# Patient Record
Sex: Female | Born: 2000 | Race: White | Hispanic: No | Marital: Single | Smoking: Former smoker
Health system: Southern US, Community
[De-identification: ages and names within clinical notes are randomized; demographics above are authoritative.]

## PROBLEM LIST (undated history)

## (undated) DIAGNOSIS — F32A Depression, unspecified: Secondary | ICD-10-CM

## (undated) DIAGNOSIS — F329 Major depressive disorder, single episode, unspecified: Secondary | ICD-10-CM

## (undated) DIAGNOSIS — F909 Attention-deficit hyperactivity disorder, unspecified type: Secondary | ICD-10-CM

## (undated) DIAGNOSIS — R519 Headache, unspecified: Secondary | ICD-10-CM

## (undated) DIAGNOSIS — F419 Anxiety disorder, unspecified: Secondary | ICD-10-CM

## (undated) DIAGNOSIS — R51 Headache: Secondary | ICD-10-CM

---
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
## 2020-01-01 LAB — NOVEL CORONAVIRUS, NAA: SARS-CoV-2, NAA: NOT DETECTED

## 2020-01-01 LAB — SARS-COV-2, NAA 2 DAY TAT

## 2020-01-07 ENCOUNTER — Other Ambulatory Visit: Payer: Self-pay | Admitting: Nurse Practitioner

## 2020-01-07 ENCOUNTER — Encounter: Payer: Self-pay | Admitting: Nurse Practitioner

## 2020-01-07 MED ORDER — CEPHALEXIN 500 MG PO CAPS
500.0000 mg | ORAL_CAPSULE | Freq: Three times a day (TID) | ORAL | 0 refills | Status: DC
Start: 2020-01-07 — End: 2020-02-25

## 2020-02-18 ENCOUNTER — Encounter: Payer: Self-pay | Admitting: Nurse Practitioner

## 2020-02-23 ENCOUNTER — Encounter: Payer: Self-pay | Admitting: Nurse Practitioner

## 2020-02-25 ENCOUNTER — Encounter: Payer: Self-pay | Admitting: Family Medicine

## 2020-02-25 ENCOUNTER — Other Ambulatory Visit: Payer: Self-pay

## 2020-02-25 ENCOUNTER — Telehealth: Payer: Self-pay | Admitting: Nurse Practitioner

## 2020-02-25 ENCOUNTER — Encounter: Payer: Self-pay | Admitting: Nurse Practitioner

## 2020-02-25 ENCOUNTER — Ambulatory Visit (INDEPENDENT_AMBULATORY_CARE_PROVIDER_SITE_OTHER): Payer: Medicaid Other | Admitting: Nurse Practitioner

## 2020-02-25 VITALS — BP 110/78 | Temp 96.0°F | Wt 128.6 lb

## 2020-02-25 DIAGNOSIS — I889 Nonspecific lymphadenitis, unspecified: Secondary | ICD-10-CM | POA: Diagnosis not present

## 2020-02-25 MED ORDER — CEPHALEXIN 500 MG PO CAPS: 500.0000 mg | ORAL_CAPSULE | Freq: Three times a day (TID) | ORAL | 0 refills | Status: DC

## 2020-02-25 NOTE — Telephone Encounter (Signed)
Patient seen today at 3:40.

## 2020-02-25 NOTE — Telephone Encounter (Signed)
Pt contacted to see if she can come in at 3:40 pm today. Pt states the she does not believe she will be able to make it due to work. Pt states that area has become bigger, she believes. Tender to touch. Pt states she would go to local Urgent Care but she knows from experience that they will have her in and out in 10 minutes.

## 2020-02-25 NOTE — Patient Instructions (Signed)
Try warm compresses to the affected area. Wear loose fitting underwear to decrease pressure and friction to the area.

## 2020-02-25 NOTE — Progress Notes (Signed)
   Subjective:    Patient ID: Lisa Cervantes, female    DOB: 2000/08/20, 19 y.o.   MRN: [id number]  HPI Pt here for bump on inside of left thigh. Has been there about 2 weeks ago. Tender to touch and when walking. Has increased in size. Denies any other lymphadenopathy. Does shave her pubic area.     Review of Systems  Constitutional: Negative for chills and fever.  Gastrointestinal: Negative for abdominal distention, abdominal pain, anal bleeding, blood in stool, constipation, diarrhea, nausea and vomiting.  Genitourinary: Negative for difficulty urinating, dyspareunia, dysuria, flank pain, frequency, genital sores, hematuria, menstrual problem, urgency, vaginal discharge and vaginal pain.       Pt states " I have a period like cramp at times when I pee" LMP 10/5, she reports they are irregular, but denies severe cramping or heavy bleeding. She is sexually active. No new sexual partners since last visit. Denies any recent bumps or sores in her pubic region.       Objective:   Physical Exam Constitutional:      Appearance: She is well-developed and well-groomed.  Cardiovascular:     Rate and Rhythm: Normal rate and regular rhythm.     Pulses: Normal pulses.     Heart sounds: Normal heart sounds. No murmur heard.   Pulmonary:     Effort: Pulmonary effort is normal.     Breath sounds: Normal breath sounds. No wheezing or rhonchi.  Lymphadenopathy:     Cervical: No cervical adenopathy.     Upper Body:     Right upper body: No supraclavicular adenopathy.     Left upper body: No supraclavicular adenopathy.     Lower Body: No right inguinal adenopathy. Left inguinal adenopathy present.     Comments: Left inguinal nodes are firm 1.5 cm in diameter and tender. Appears to be 2 nodes together towards the lower left inguinal area.   Skin:    General: Skin is warm and dry.     Comments: Pelvic and inguinal region is shaved without folliculitis, erythema, rash, lesions, or abscess.    Neurological:     Mental Status: She is alert.     Today's Vitals   02/25/20 1553  BP: 110/78  Temp: (!) 96 F (35.6 C)  Weight: 58.3 kg   Body mass index is 22.78 kg/m.      Assessment & Plan:   Inguinal lymphadenitis  Meds ordered this encounter  Medications  . cephALEXin (KEFLEX) 500 MG capsule    Sig: Take 1 capsule (500 mg total) by mouth 3 (three) times daily.    Dispense:  21 capsule    Refill:  0    Order Specific Question:   Supervising Provider    Answer:   Lilyan Punt A [[id number]]    Try warm compresses to the affected area. Wear loose fitting underwear to decrease pressure and friction to the area. Call back in 7 days if no improvement, sooner if worse. Warning signs reviewed.  Return if symptoms worsen or fail to improve.

## 2020-02-27 ENCOUNTER — Encounter: Payer: Self-pay | Admitting: Nurse Practitioner

## 2020-02-27 NOTE — Progress Notes (Signed)
   Subjective:    Patient ID: Lisa Cervantes, female    DOB: 2001/02/21, 18 y.o.   MRN: [id number]  HPI    Review of Systems     Objective:   Physical Exam        Assessment & Plan:

## 2020-02-28 ENCOUNTER — Encounter: Payer: Self-pay | Admitting: Nurse Practitioner

## 2020-03-21 ENCOUNTER — Encounter: Payer: Self-pay | Admitting: Nurse Practitioner

## 2020-03-23 ENCOUNTER — Other Ambulatory Visit: Payer: Self-pay

## 2020-03-23 ENCOUNTER — Telehealth (INDEPENDENT_AMBULATORY_CARE_PROVIDER_SITE_OTHER): Payer: Medicaid Other | Admitting: Family Medicine

## 2020-03-23 ENCOUNTER — Encounter: Payer: Self-pay | Admitting: Family Medicine

## 2020-03-23 DIAGNOSIS — T7840XD Allergy, unspecified, subsequent encounter: Secondary | ICD-10-CM

## 2020-03-23 DIAGNOSIS — R059 Cough, unspecified: Secondary | ICD-10-CM | POA: Diagnosis not present

## 2020-03-23 DIAGNOSIS — T7840XA Allergy, unspecified, initial encounter: Secondary | ICD-10-CM | POA: Insufficient documentation

## 2020-03-23 MED ORDER — BENZONATATE 100 MG PO CAPS: 100.0000 mg | ORAL_CAPSULE | Freq: Two times a day (BID) | ORAL | 0 refills | Status: DC | PRN

## 2020-03-23 MED ORDER — ALBUTEROL SULFATE HFA 108 (90 BASE) MCG/ACT IN AERS: 2.0000 | INHALATION_SPRAY | Freq: Four times a day (QID) | RESPIRATORY_TRACT | 1 refills | Status: DC | PRN

## 2020-03-23 MED ORDER — CETIRIZINE HCL 10 MG PO TABS: 10.0000 mg | ORAL_TABLET | Freq: Every day | ORAL | 2 refills | Status: DC

## 2020-03-23 NOTE — Progress Notes (Signed)
Patient ID: Lisa Cervantes, female    DOB: [birth year], 19 y.o.   MRN: [id number]   Chief Complaint  Patient presents with  . Sore Throat   Subjective:  CC: throat scratchy and dry cough  Presents today for a telephone visit.  Virtual video visit attempted unable to connect.  She complains of a scratchy throat and a slightly dry cough.  The onset of symptoms was 4 days ago.  Pertinent negatives include no fever, no chills, no fatigue, no headache, no chest pain, no shortness of breath, no congestion, no ear pain, no abdominal pain, no urinary symptoms.  Associated symptoms include cough and pertinent information is she is a smoker.  As the conversation progressed she reported that she is unable to take a deep breath but does not describe it as shortness of breath.  She reports that she does have seasonal allergies.  Sore Throat  This is a new problem. Episode onset: 4 days ago. Associated symptoms include coughing, a hoarse voice and shortness of breath. Pertinent negatives include no abdominal pain, congestion or ear pain. Treatments tried: otc allergy.   See phq9.   Virtual Visit via Video Note  I connected with [name] on 03/23/20 at  2:30 PM EST by a video enabled telemedicine application and verified that I am speaking with the correct person using two identifiers.  Location: Patient: home Provider: office    I discussed the limitations of evaluation and management by telemedicine and the availability of in person appointments. The patient expressed understanding and agreed to proceed.  History of Present Illness:    Observations/Objective:   Assessment and Plan:   Follow Up Instructions:    I discussed the assessment and treatment plan with the patient. The patient was provided an opportunity to ask questions and all were answered. The patient agreed with the plan and demonstrated an understanding of the instructions.   The patient was advised to call back  or seek an in-person evaluation if the symptoms worsen or if the condition fails to improve as anticipated.  I provide 13 minutes of non-face-to-face time during this encounter.    Medical History Elsy has a past medical history of ADHD (attention deficit hyperactivity disorder), Anxiety, Depression, and Headache.   Outpatient Encounter Medications as of 03/23/2020  Medication Sig  . albuterol (VENTOLIN HFA) 108 (90 Base) MCG/ACT inhaler Inhale 2 puffs into the lungs every 6 (six) hours as needed for wheezing or shortness of breath.  . benzonatate (TESSALON) 100 MG capsule Take 1 capsule (100 mg total) by mouth 2 (two) times daily as needed for cough.  . cetirizine (ZYRTEC) 10 MG tablet Take 1 tablet (10 mg total) by mouth daily.  . [DISCONTINUED] cephALEXin (KEFLEX) 500 MG capsule Take 1 capsule (500 mg total) by mouth 3 (three) times daily.   No facility-administered encounter medications on file as of 03/23/2020.     Review of Systems  Constitutional: Negative for chills, fatigue and fever.  HENT: Positive for hoarse voice. Negative for congestion, ear pain, sinus pressure, sinus pain and sore throat.   Respiratory: Positive for cough and shortness of breath.        Describes as being unable to take a full deep breath.  Cardiovascular: Negative for chest pain.  Gastrointestinal: Negative for abdominal pain.  Genitourinary: Negative for dysuria.     Vitals There were no vitals taken for this visit. unable Objective:   Physical Exam unable  Assessment and Plan  1. Allergy, subsequent encounter - cetirizine (ZYRTEC) 10 MG tablet; Take 1 tablet (10 mg total) by mouth daily.  Dispense: 30 tablet; Refill: 2 - albuterol (VENTOLIN HFA) 108 (90 Base) MCG/ACT inhaler; Inhale 2 puffs into the lungs every 6 (six) hours as needed for wheezing or shortness of breath.  Dispense: 18 g; Refill: 1 - benzonatate (TESSALON) 100 MG capsule; Take 1 capsule (100 mg total) by mouth 2 (two)  times daily as needed for cough.  Dispense: 20 capsule; Refill: 0  2. Cough - benzonatate (TESSALON) 100 MG capsule; Take 1 capsule (100 mg total) by mouth 2 (two) times daily as needed for cough.  Dispense: 20 capsule; Refill: 0   Concerned for mild intermittent asthma exacerbated by seasonal allergies.  She will start back on her daily Zyrtec.  Albuterol inhaler 2 puffs every 6 hours for shortness of breath.  We will treat the cough with Sondra Come.  Agrees with plan of care discussed today. Understands warning signs to seek further care: Fever, chills, shortness of breath that is not relieved by the albuterol inhaler. Understands to follow-up in 4 weeks, to assess possible asthma and albuterol inhaler effectiveness.  She can follow-up sooner than 4 weeks if needed.

## 2020-06-09 ENCOUNTER — Ambulatory Visit (INDEPENDENT_AMBULATORY_CARE_PROVIDER_SITE_OTHER): Payer: Medicaid Other | Admitting: Nurse Practitioner

## 2020-06-09 ENCOUNTER — Other Ambulatory Visit: Payer: Self-pay

## 2020-06-09 DIAGNOSIS — B9689 Other specified bacterial agents as the cause of diseases classified elsewhere: Secondary | ICD-10-CM | POA: Diagnosis not present

## 2020-06-09 DIAGNOSIS — J019 Acute sinusitis, unspecified: Secondary | ICD-10-CM | POA: Diagnosis not present

## 2020-06-09 MED ORDER — AMOXICILLIN-POT CLAVULANATE 875-125 MG PO TABS: 1.0000 | ORAL_TABLET | Freq: Two times a day (BID) | ORAL | 0 refills | Status: DC

## 2020-06-09 MED ORDER — FLUTICASONE PROPIONATE 50 MCG/ACT NA SUSP: 2.0000 | Freq: Every day | NASAL | 5 refills | Status: DC

## 2020-06-09 NOTE — Progress Notes (Signed)
   Subjective:    Patient ID: Lisa Cervantes, female    DOB: [birth year], 20 y.o.   MRN: [id number]  HPI  Patient presents today with respiratory illness Number of days present- 06/01/20  Symptoms include-head congestion   Presence of worrisome signs (severe shortness of breath, lethargy, etc.) -none  Recent/current visit to urgent care or ER-none  Recent direct exposure to Covid-none  Any current Covid testing-pt had positive test on 06/01/20 but negative test on 06/05/20   Complaints of an occasional cough mainly in the morning.  Producing clear mucus.  No fever.  No wheezing or chest pain.  Head congestion with a sinus area headache in the middle of her forehead.  No ear pain or sore throat.  No relief with Mucinex or NyQuil.  Taking fluids well.  Voiding normal limit.  Is not currently on any birth control, had a cycle last month. Review of Systems     Objective:   Physical Exam NAD.  Alert, oriented.  Unable to get her O2 sat because of false fingernails giving inaccurate reading.  Normal color.  No tachypnea.  TMs retracted bilaterally, no erythema.  Pharynx mildly injected with green PND noted.  Tenderness noted with palpation of the frontal sinuses.  Neck supple with mild soft anterior adenopathy.  Lungs clear.  Heart regular rate rhythm.       Assessment & Plan:  Acute bacterial rhinosinusitis  Meds ordered this encounter  Medications  . amoxicillin-clavulanate (AUGMENTIN) 875-125 MG tablet    Sig: Take 1 tablet by mouth 2 (two) times daily.    Dispense:  20 tablet    Refill:  0    Order Specific Question:   Supervising Provider    Answer:   Lilyan Punt A [[id number]]  . fluticasone (FLONASE) 50 MCG/ACT nasal spray    Sig: Place 2 sprays into both nostrils daily. Prn head congestion    Dispense:  16 g    Refill:  5    Order Specific Question:   Supervising Provider    Answer:   Lilyan Punt A [[id number]]   Warning signs reviewed.  Call back next week if no improvement,  sooner if worse.

## 2020-06-10 ENCOUNTER — Encounter: Payer: Self-pay | Admitting: Nurse Practitioner

## 2020-07-03 ENCOUNTER — Encounter: Payer: Self-pay | Admitting: Nurse Practitioner

## 2020-07-26 ENCOUNTER — Ambulatory Visit (INDEPENDENT_AMBULATORY_CARE_PROVIDER_SITE_OTHER): Payer: Medicaid Other | Admitting: Internal Medicine

## 2020-07-31 ENCOUNTER — Other Ambulatory Visit: Payer: Self-pay

## 2020-07-31 ENCOUNTER — Encounter (INDEPENDENT_AMBULATORY_CARE_PROVIDER_SITE_OTHER): Payer: Self-pay | Admitting: Internal Medicine

## 2020-07-31 ENCOUNTER — Ambulatory Visit (INDEPENDENT_AMBULATORY_CARE_PROVIDER_SITE_OTHER): Payer: Medicaid Other | Admitting: Internal Medicine

## 2020-07-31 VITALS — BP 110/74 | HR 94 | Temp 97.9°F | Ht 63.0 in | Wt 120.0 lb

## 2020-07-31 DIAGNOSIS — R5383 Other fatigue: Secondary | ICD-10-CM | POA: Diagnosis not present

## 2020-07-31 DIAGNOSIS — E282 Polycystic ovarian syndrome: Secondary | ICD-10-CM

## 2020-07-31 DIAGNOSIS — R5381 Other malaise: Secondary | ICD-10-CM | POA: Diagnosis not present

## 2020-07-31 NOTE — Progress Notes (Signed)
Metrics: Intervention Frequency ACO  Documented Smoking Status Yearly  Screened one or more times in 24 months  Cessation Counseling or  Active cessation medication Past 24 months  Past 24 months   Guideline developer: UpToDate (See UpToDate for funding source) Date Released: 2014       Wellness Office Visit  Subjective:  Patient ID: Lisa Cervantes, female    DOB: 2000-12-28  Age: 20 y.o. MRN: [id number]  CC: This 20 year old lady comes to get further opinion regarding her infertility. HPI  She and her fianc of 2 years have been trying to become pregnant for a while now.  Her difficulty seems to be that she has prolonged periods of time before her menstrual cycles usually 39 to 41 days.  However, she recently went for 46 days.  She has been seen in the past by OB/GYN, Dr. Emelda Fear, when he was still practicing and was tried on Clomid without success. The patient has decided that she would like to have a fairly large family and wants to start reproducing early in life. She does describe some acne and also it appears irregular cycles and also mood swings.  She denies any hirsutism and certainly has no obesity.  Her lipid panel in the past was normal. Past Medical History:  Diagnosis Date  . ADHD (attention deficit hyperactivity disorder)   . Anxiety   . Depression   . Headache    History reviewed. No pertinent surgical history.   Family History  Problem Relation Age of Onset  . Hyperlipidemia Maternal Grandmother   . Heart disease Maternal Grandfather   . Alcohol abuse Father     Social History   Social History Narrative   Lives with fiancee(been together 20 years).Works at [employer].   Social History   Tobacco Use  . Smoking status: Current Every Day Smoker    Packs/day: 0.25    Types: Cigarettes  . Smokeless tobacco: Never Used  Substance Use Topics  . Alcohol use: No    Current Meds  Medication Sig  . BLACK ELDERBERRY PO Take by mouth. Takes 2 gummy's a  day  . fluticasone (FLONASE) 50 MCG/ACT nasal spray Place 2 sprays into both nostrils daily. Prn head congestion  . MULTIPLE VITAMIN PO Take by mouth. Patient takes 2 gummy vitamins a day     Flowsheet Row Office Visit from 20/21/2022 in [facility]  PHQ-9 Total Score 0      Objective:   Today's Vitals: BP 110/74   Pulse 94   Temp 97.9 F (36.6 C) (Temporal)   Ht 5\' 3"  (1.6 m)   Wt 120 lb (54.4 kg)   SpO2 98%   BMI 21.26 kg/m  Vitals with BMI 07/31/2020 02/25/2020 12/30/2019  Height 5\' 3"  - -  Weight 120 lbs 128 lbs 10 oz -  BMI 21.26 - -  Systolic 110 110 -  Diastolic 74 78 -  Pulse 94 - 95  Some encounter information is confidential and restricted. Go to Review Flowsheets activity to see all data.     Physical Exam  Healthy looking young lady.  Blood pressure in a good range.  There is some acne on her face but no hirsutism.    Assessment   1. PCOS (polycystic ovarian syndrome)   2. Malaise and fatigue       Tests ordered Orders Placed This Encounter  Procedures  . Follicle stimulating hormone  . Luteinizing hormone  . Testos,Total,Free and SHBG (Female)  . T3, free  .  T4, free  . TSH     Plan: 1. I explained to the patient that I am not a fertility physician but I treat women with PCOS and I think it is worth looking for this diagnosis in case this is the cause of her infertility.  We will do the blood work and I will see her back for review.  If the diagnosis of PCOS is not definitive, I will then refer to another GYN, Dr. Adalberto Ill, in [place] for further evaluation.   No orders of the defined types were placed in this encounter.   Wilson Singer, MD

## 2020-08-01 ENCOUNTER — Encounter (INDEPENDENT_AMBULATORY_CARE_PROVIDER_SITE_OTHER): Payer: Self-pay | Admitting: Internal Medicine

## 2020-08-03 LAB — T4, FREE: Free T4: 1.2 ng/dL (ref 0.8–1.4)

## 2020-08-03 LAB — T3, FREE: T3, Free: 3.4 pg/mL (ref 3.0–4.7)

## 2020-08-03 LAB — FOLLICLE STIMULATING HORMONE: FSH: 5.7 m[IU]/mL

## 2020-08-03 LAB — TESTOS,TOTAL,FREE AND SHBG (FEMALE)
Free Testosterone: 2.1 pg/mL (ref 0.1–6.4)
Sex Hormone Binding: 39 nmol/L (ref 17–124)
Testosterone, Total, LC-MS-MS: 19 ng/dL (ref 2–45)

## 2020-08-03 LAB — LUTEINIZING HORMONE: LH: 2.5 m[IU]/mL

## 2020-08-03 LAB — TSH: TSH: 0.57 mIU/L

## 2020-08-04 ENCOUNTER — Ambulatory Visit (INDEPENDENT_AMBULATORY_CARE_PROVIDER_SITE_OTHER): Payer: Medicaid Other | Admitting: Nurse Practitioner

## 2020-08-04 ENCOUNTER — Encounter (INDEPENDENT_AMBULATORY_CARE_PROVIDER_SITE_OTHER): Payer: Self-pay | Admitting: Internal Medicine

## 2020-08-04 ENCOUNTER — Encounter: Payer: Self-pay | Admitting: Nurse Practitioner

## 2020-08-04 ENCOUNTER — Other Ambulatory Visit: Payer: Self-pay

## 2020-08-04 VITALS — BP 103/67 | Temp 97.9°F | Wt 118.0 lb

## 2020-08-04 DIAGNOSIS — R634 Abnormal weight loss: Secondary | ICD-10-CM | POA: Diagnosis not present

## 2020-08-04 DIAGNOSIS — K219 Gastro-esophageal reflux disease without esophagitis: Secondary | ICD-10-CM | POA: Diagnosis not present

## 2020-08-04 DIAGNOSIS — F419 Anxiety disorder, unspecified: Secondary | ICD-10-CM

## 2020-08-04 DIAGNOSIS — R6881 Early satiety: Secondary | ICD-10-CM | POA: Diagnosis not present

## 2020-08-04 MED ORDER — ESCITALOPRAM OXALATE 10 MG PO TABS
10.0000 mg | ORAL_TABLET | Freq: Every day | ORAL | 0 refills | Status: DC
Start: 2020-08-04 — End: 2020-11-06

## 2020-08-04 MED ORDER — PANTOPRAZOLE SODIUM 40 MG PO TBEC: 40.0000 mg | DELAYED_RELEASE_TABLET | Freq: Every day | ORAL | 0 refills | Status: DC

## 2020-08-04 NOTE — Patient Instructions (Signed)
Gastroesophageal Reflux Disease, Adult  Gastroesophageal reflux (GER) happens when acid from the stomach flows up into the tube that connects the mouth and the stomach (esophagus). Normally, food travels down the esophagus and stays in the stomach to be digested. With GER, food and stomach acid sometimes move back up into the esophagus. You may have a disease called gastroesophageal reflux disease (GERD) if the reflux:  Happens often.  Causes frequent or very bad symptoms.  Causes problems such as damage to the esophagus. When this happens, the esophagus becomes sore and swollen. Over time, GERD can make small holes (ulcers) in the lining of the esophagus. What are the causes? This condition is caused by a problem with the muscle between the esophagus and the stomach. When this muscle is weak or not normal, it does not close properly to keep food and acid from coming back up from the stomach. The muscle can be weak because of:  Tobacco use.  Pregnancy.  Having a certain type of hernia (hiatal hernia).  Alcohol use.  Certain foods and drinks, such as coffee, chocolate, onions, and peppermint. What increases the risk?  Being overweight.  Having a disease that affects your connective tissue.  Taking NSAIDs, such a ibuprofen. What are the signs or symptoms?  Heartburn.  Difficult or painful swallowing.  The feeling of having a lump in the throat.  A bitter taste in the mouth.  Bad breath.  Having a lot of saliva.  Having an upset or bloated stomach.  Burping.  Chest pain. Different conditions can cause chest pain. Make sure you see your doctor if you have chest pain.  Shortness of breath or wheezing.  A long-term cough or a cough at night.  Wearing away of the surface of teeth (tooth enamel).  Weight loss. How is this treated?  Making changes to your diet.  Taking medicine.  Having surgery. Treatment will depend on how bad your symptoms are. Follow these  instructions at home: Eating and drinking  Follow a diet as told by your doctor. You may need to avoid foods and drinks such as: ? Coffee and tea, with or without caffeine. ? Drinks that contain alcohol. ? Energy drinks and sports drinks. ? Bubbly (carbonated) drinks or sodas. ? Chocolate and cocoa. ? Peppermint and mint flavorings. ? Garlic and onions. ? Horseradish. ? Spicy and acidic foods. These include peppers, chili powder, curry powder, vinegar, hot sauces, and BBQ sauce. ? Citrus fruit juices and citrus fruits, such as oranges, lemons, and limes. ? Tomato-based foods. These include red sauce, chili, salsa, and pizza with red sauce. ? Fried and fatty foods. These include donuts, french fries, potato chips, and high-fat dressings. ? High-fat meats. These include hot dogs, rib eye steak, sausage, ham, and bacon. ? High-fat dairy items, such as whole milk, butter, and cream cheese.  Eat small meals often. Avoid eating large meals.  Avoid drinking large amounts of liquid with your meals.  Avoid eating meals during the 2-3 hours before bedtime.  Avoid lying down right after you eat.  Do not exercise right after you eat.   Lifestyle  Do not smoke or use any products that contain nicotine or tobacco. If you need help quitting, ask your doctor.  Try to lower your stress. If you need help doing this, ask your doctor.  If you are overweight, lose an amount of weight that is healthy for you. Ask your doctor about a safe weight loss goal.   General instructions    Pay attention to any changes in your symptoms.  Take over-the-counter and prescription medicines only as told by your doctor.  Do not take aspirin, ibuprofen, or other NSAIDs unless your doctor says it is okay.  Wear loose clothes. Do not wear anything tight around your waist.  Raise (elevate) the head of your bed about 6 inches (15 cm). You may need to use a wedge to do this.  Avoid bending over if this makes your  symptoms worse.  Keep all follow-up visits. Contact a doctor if:  You have new symptoms.  You lose weight and you do not know why.  You have trouble swallowing or it hurts to swallow.  You have wheezing or a cough that keeps happening.  You have a hoarse voice.  Your symptoms do not get better with treatment. Get help right away if:  You have sudden pain in your arms, neck, jaw, teeth, or back.  You suddenly feel sweaty, dizzy, or light-headed.  You have chest pain or shortness of breath.  You vomit and the vomit is green, yellow, or black, or it looks like blood or coffee grounds.  You faint.  Your poop (stool) is red, bloody, or black.  You cannot swallow, drink, or eat. These symptoms may represent a serious problem that is an emergency. Do not wait to see if the symptoms will go away. Get medical help right away. Call your local emergency services (911 in the [place] Do not drive yourself to the hospital. Summary  If a person has gastroesophageal reflux disease (GERD), food and stomach acid move back up into the esophagus and cause symptoms or problems such as damage to the esophagus.  Treatment will depend on how bad your symptoms are.  Follow a diet as told by your doctor.  Take all medicines only as told by your doctor. This information is not intended to replace advice given to you by your health care provider. Make sure you discuss any questions you have with your health care provider. Document Revised: 11/08/2019 Document Reviewed: 11/08/2019 Elsevier Patient Education  2021 [place] for Gastroesophageal Reflux Disease, Adult When you have gastroesophageal reflux disease (GERD), the foods you eat and your eating habits are very important. Choosing the right foods can help ease your discomfort. Think about working with a food expert (dietitian) to help you make good choices. What are tips for following this plan? Reading food labels  Look  for foods that are low in saturated fat. Foods that may help with your symptoms include: ? Foods that have less than 5% of daily value (DV) of fat. ? Foods that have 0 grams of trans fat. Cooking  Do not fry your food.  Cook your food by baking, steaming, grilling, or broiling. These are all methods that do not need a lot of fat for cooking.  To add flavor, try to use herbs that are low in spice and acidity. Meal planning  Choose healthy foods that are low in fat, such as: ? Fruits and vegetables. ? Whole grains. ? Low-fat dairy products. ? Lean meats, fish, and poultry.  Eat small meals often instead of eating 3 large meals each day. Eat your meals slowly in a place where you are relaxed. Avoid bending over or lying down until 2-3 hours after eating.  Limit high-fat foods such as fatty meats or fried foods.  Limit your intake of fatty foods, such as oils, butter, and shortening.  Avoid the following as told by  by your doctor: ? Foods that cause symptoms. These may be different for different people. Keep a food diary to keep track of foods that cause symptoms. ? Alcohol. ? Drinking a lot of liquid with meals. ? Eating meals during the 2-3 hours before bed.   Lifestyle  Stay at a healthy weight. Ask your doctor what weight is healthy for you. If you need to lose weight, work with your doctor to do so safely.  Exercise for at least 30 minutes on 5 or more days each week, or as told by your doctor.  Wear loose-fitting clothes.  Do not smoke or use any products that contain nicotine or tobacco. If you need help quitting, ask your doctor.  Sleep with the head of your bed higher than your feet. Use a wedge under the mattress or blocks under the bed frame to raise the head of the bed.  Chew sugar-free gum after meals. What foods should eat? Eat a healthy, well-balanced diet of fruits, vegetables, whole grains, low-fat dairy products, lean meats, fish, and poultry. Each person is  different. Foods that may cause symptoms in one person may not cause any symptoms in another person. Work with your doctor to find foods that are safe for you. The items listed above may not be a complete list of what you can eat and drink. Contact a food expert for more options.   What foods should I avoid? Limiting some of these foods may help in managing the symptoms of GERD. Everyone is different. Talk with a food expert or your doctor to help you find the exact foods to avoid, if any. Fruits Any fruits prepared with added fat. Any fruits that cause symptoms. For some people, this may include citrus fruits, such as oranges, grapefruit, pineapple, and lemons. Vegetables Deep-fried vegetables. French fries. Any vegetables prepared with added fat. Any vegetables that cause symptoms. For some people, this may include tomatoes and tomato products, chili peppers, onions and garlic, and horseradish. Grains Pastries or quick breads with added fat. Meats and other proteins High-fat meats, such as fatty beef or pork, hot dogs, ribs, ham, sausage, salami, and bacon. Fried meat or protein, including fried fish and fried chicken. Nuts and nut butters, in large amounts. Dairy Whole milk and chocolate milk. Sour cream. Cream. Ice cream. Cream cheese. Milkshakes. Fats and oils Butter. Margarine. Shortening. Ghee. Beverages Coffee and tea, with or without caffeine. Carbonated beverages. Sodas. Energy drinks. Fruit juice made with acidic fruits, such as orange or grapefruit. Tomato juice. Alcoholic drinks. Sweets and desserts Chocolate and cocoa. Donuts. Seasonings and condiments Pepper. Peppermint and spearmint. Added salt. Any condiments, herbs, or seasonings that cause symptoms. For some people, this may include curry, hot sauce, or vinegar-based salad dressings. The items listed above may not be a complete list of what you should not eat and drink. Contact a food expert for more options. Questions to  ask your doctor Diet and lifestyle changes are often the first steps that are taken to manage symptoms of GERD. If diet and lifestyle changes do not help, talk with your doctor about taking medicines. Where to find more information  [employer] for Gastrointestinal Disorders: aboutgerd.org Summary  When you have GERD, food and lifestyle choices are very important in easing your symptoms.  Eat small meals often instead of 3 large meals a day. Eat your meals slowly and in a place where you are relaxed.  Avoid bending over or lying down until 2-3 hours after eating.    Limit high-fat foods such as fatty meats or fried foods. This information is not intended to replace advice given to you by your health care provider. Make sure you discuss any questions you have with your health care provider. Document Revised: 11/08/2019 Document Reviewed: 11/08/2019 Elsevier Patient Education  [place].

## 2020-08-04 NOTE — Progress Notes (Signed)
Subjective:    Patient ID: Lisa Cervantes, female    DOB: 05-Jun-2000, 20 y.o.   MRN: [id number]  HPI Pt here to discuss possible bipolar. Pt states that one minute she is fine and the next she is flipping out. Fiance has noticed her being more agitated recently.  Patient admits to more stress recently.  Has never been diagnosed with bipolar disorder.  Complaints of emotional lability and getting upset easily.  Has lost a significant amount of weight.  Denies any eating disorders such as anorexia nervosa or bulimia.  About 4-5 times per week patient will have early satiety after only a few bites of food.  When she does get hungry and eats a meal she feels nauseated afterwards.  No vomiting.  No abdominal pain.  Constipation at times, no diarrhea.  No obvious GERD.  No abdominal pain.  Smokes less than a half a pack per day.  Denies any alcohol drug or marijuana use.  Very limited NSAID use.  Some caffeine intake.  Denies any suicidal or homicidal thoughts or ideation.  Has had multiple issues with fertility and her cycle, currently on her menstrual cycle. Patient Active Problem List   Diagnosis Date Noted  . Unexplained weight loss 08/04/2020  . Gastroesophageal reflux disease without esophagitis 08/04/2020  . Allergies 03/23/2020  . Cough 03/23/2020  . Viral respiratory illness 12/30/2019  . Restless leg syndrome 12/04/2019  . Infertility, anovulation 09/17/2019  . Irregular periods/menstrual cycles 05/17/2019  . Anxiety 08/26/2017  . Psychophysiological insomnia 06/12/2017  . Acne vulgaris 09/23/2016  . Major depressive disorder, single episode, severe without psychotic features (HCC)   . Cannabis use disorder, mild, abuse   . Migraine headache without aura 12/20/2013  . Left knee pain 09/12/2013  . 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 Social anxiety disorder 08/02/2013   GAD 7 : Generalized Anxiety Score 08/04/2020 02/12/2019 07/28/2018   Nervous, Anxious, on Edge 3 3 2   Control/stop worrying 3 3 3   Worry too much - different things 3 3 3   Trouble relaxing 2 3 2   Restless 1 3 2   Easily annoyed or irritable 3 3 3   Afraid - awful might happen 0 1 2  Total GAD 7 Score 15 19 17   Anxiety Difficulty Somewhat difficult Extremely difficult Very difficult    Depression screen [facility] 2/9 08/04/2020 07/31/2020 03/23/2020 07/28/2018 06/09/2017  Decreased Interest 2 0 1 2 2   Down, Depressed, Hopeless 0 0 0 3 2  PHQ - 2 Score 2 0 1 5 4   Altered sleeping 3 0 3 3 2   Tired, decreased energy 3 0 1 3 2   Change in appetite 1 0 0 3 2  Feeling bad or failure about yourself  0 0 0 2 1  Trouble concentrating 1 0 1 1 1   Moving slowly or fidgety/restless 1 0 3 2 0  Suicidal thoughts 0 0 0 3 0  PHQ-9 Score 11 0 9 22 12   Difficult doing work/chores Very difficult Not difficult at all Somewhat difficult Very difficult -     Review of Systems     Objective:   Physical Exam NAD.  Alert, oriented.  Moderately anxious affect.  Making good eye contact.  Dressed appropriately.  Thoughts logical coherent and relevant.  Speech clear.  Lungs clear.  Heart regular rate rhythm.  Unintended weight loss of 10 pounds since last summer.  Abdomen soft nondistended with distinct significant epigastric area tenderness with palpation.  No  masses noted. Today's Vitals   08/04/20 1124  BP: 103/67  Temp: 97.9 F (36.6 C)  Weight: 118 lb (53.5 kg)   Body mass index is 20.9 kg/m.      Assessment & Plan:   Problem List Items Addressed This Visit      Digestive   Gastroesophageal reflux disease without esophagitis   Relevant Medications   pantoprazole (PROTONIX) 40 MG tablet     Other   Anxiety - Primary   Relevant Medications   escitalopram (LEXAPRO) 10 MG tablet   Other Relevant Orders   Ambulatory referral to Psychiatry   Unexplained weight loss    Other Visit Diagnoses    Early satiety          Meds ordered this encounter  Medications  .  escitalopram (LEXAPRO) 10 MG tablet    Sig: Take 1 tablet (10 mg total) by mouth daily. For anxiety    Dispense:  30 tablet    Refill:  0    Order Specific Question:   Supervising Provider    Answer:   Lilyan Punt A [[id number]]  . pantoprazole (PROTONIX) 40 MG tablet    Sig: Take 1 tablet (40 mg total) by mouth daily. For acid reflux    Dispense:  30 tablet    Refill:  0    Order Specific Question:   Supervising Provider    Answer:   Babs Sciara [phone]   Refer to psychiatry/mental health provider for medication management and counseling.  Patient understands this may take a significant amount of time.  In the meantime she agrees to try escitalopram as directed.  Reviewed potential adverse effects including black box warnings.  DC medication if worsening depression anxiety or suicidal thoughts.  Patient also agrees to seek help immediately if she becomes suicidal. Extensive discussion regarding her reflux, given written or verbal information on lifestyle factors affecting her GERD.  Recommend reducing caffeine intake and continue smoking cessation efforts.  Start pantoprazole as directed and follow-up in 2 weeks for reevaluation, call back sooner if worse. Return in about 2 weeks (around 08/18/2020) for Recheck. 30 minutes was spent with the patient including previsit chart review, time spent with patient, discussion of health issues, review of data including medical record, and documentation of the visit. Time also spent in referral to mental health and coordination of care.

## 2020-08-05 ENCOUNTER — Encounter (HOSPITAL_COMMUNITY): Payer: Self-pay | Admitting: Emergency Medicine

## 2020-08-05 ENCOUNTER — Other Ambulatory Visit: Payer: Self-pay

## 2020-08-05 ENCOUNTER — Emergency Department (HOSPITAL_COMMUNITY)
Admission: EM | Admit: 2020-08-05 | Discharge: 2020-08-05 | Disposition: A | Discharge status: Home / Self Care | Payer: Medicaid Other | Attending: Emergency Medicine | Admitting: Emergency Medicine

## 2020-08-05 ENCOUNTER — Encounter: Payer: Self-pay | Admitting: Nurse Practitioner

## 2020-08-05 ENCOUNTER — Emergency Department (HOSPITAL_COMMUNITY): Payer: Medicaid Other

## 2020-08-05 DIAGNOSIS — R102 Pelvic and perineal pain: Secondary | ICD-10-CM | POA: Insufficient documentation

## 2020-08-05 DIAGNOSIS — R1111 Vomiting without nausea: Secondary | ICD-10-CM | POA: Diagnosis not present

## 2020-08-05 DIAGNOSIS — F17210 Nicotine dependence, cigarettes, uncomplicated: Secondary | ICD-10-CM | POA: Diagnosis not present

## 2020-08-05 DIAGNOSIS — N8302 Follicular cyst of left ovary: Secondary | ICD-10-CM | POA: Diagnosis not present

## 2020-08-05 DIAGNOSIS — N8301 Follicular cyst of right ovary: Secondary | ICD-10-CM | POA: Diagnosis not present

## 2020-08-05 DIAGNOSIS — N888 Other specified noninflammatory disorders of cervix uteri: Secondary | ICD-10-CM | POA: Diagnosis not present

## 2020-08-05 DIAGNOSIS — R531 Weakness: Secondary | ICD-10-CM | POA: Diagnosis not present

## 2020-08-05 DIAGNOSIS — R935 Abnormal findings on diagnostic imaging of other abdominal regions, including retroperitoneum: Secondary | ICD-10-CM | POA: Diagnosis not present

## 2020-08-05 DIAGNOSIS — K219 Gastro-esophageal reflux disease without esophagitis: Secondary | ICD-10-CM | POA: Diagnosis not present

## 2020-08-05 LAB — URINALYSIS, ROUTINE W REFLEX MICROSCOPIC
Bilirubin Urine: NEGATIVE
Glucose, UA: NEGATIVE mg/dL
Ketones, ur: 80 mg/dL — AB
Leukocytes,Ua: NEGATIVE
Nitrite: NEGATIVE
Protein, ur: 100 mg/dL — AB
RBC / HPF: >50 RBC/hpf — ABNORMAL HIGH (ref 0–5)
Specific Gravity, Urine: 1.027 (ref 1.005–1.030)
pH: 8 (ref 5.0–8.0)

## 2020-08-05 LAB — COMPREHENSIVE METABOLIC PANEL
ALT: 21 U/L (ref 0–44)
AST: 21 U/L (ref 15–41)
Albumin: 4.6 g/dL (ref 3.5–5.0)
Alkaline Phosphatase: 54 U/L (ref 38–126)
Anion gap: 13 (ref 5–15)
BUN: 10 mg/dL (ref 6–20)
CO2: 19 mmol/L — ABNORMAL LOW (ref 22–32)
Calcium: 9.4 mg/dL (ref 8.9–10.3)
Chloride: 105 mmol/L (ref 98–111)
Creatinine, Ser: 0.71 mg/dL (ref 0.44–1.00)
GFR, Estimated: >60 mL/min (ref >60)
Glucose, Bld: 151 mg/dL — ABNORMAL HIGH (ref 70–99)
Potassium: 3.5 mmol/L (ref 3.5–5.1)
Sodium: 137 mmol/L (ref 135–145)
Total Bilirubin: 1.1 mg/dL (ref 0.3–1.2)
Total Protein: 8.6 g/dL — ABNORMAL HIGH (ref 6.5–8.1)

## 2020-08-05 LAB — CBC WITH DIFFERENTIAL/PLATELET
Abs Immature Granulocytes: 0.1 10*3/uL — ABNORMAL HIGH (ref 0.00–0.07)
Basophils Absolute: 0.1 10*3/uL (ref 0.0–0.1)
Basophils Relative: 1 %
Eosinophils Absolute: 0.1 10*3/uL (ref 0.0–0.5)
Eosinophils Relative: 0 %
HCT: 42.1 % (ref 36.0–46.0)
Hemoglobin: 14.2 g/dL (ref 12.0–15.0)
Immature Granulocytes: 1 %
Lymphocytes Relative: 7 %
Lymphs Abs: 1.3 10*3/uL (ref 0.7–4.0)
MCH: 31.6 pg (ref 26.0–34.0)
MCHC: 33.7 g/dL (ref 30.0–36.0)
MCV: 93.8 fL (ref 80.0–100.0)
Monocytes Absolute: 0.7 10*3/uL (ref 0.1–1.0)
Monocytes Relative: 4 %
Neutro Abs: 15 10*3/uL — ABNORMAL HIGH (ref 1.7–7.7)
Neutrophils Relative %: 87 %
Platelets: 333 10*3/uL (ref 150–400)
RBC: 4.49 MIL/uL (ref 3.87–5.11)
RDW: 12 % (ref 11.5–15.5)
WBC: 17.2 10*3/uL — ABNORMAL HIGH (ref 4.0–10.5)
nRBC: 0 % (ref 0.0–0.2)

## 2020-08-05 LAB — HCG, QUANTITATIVE, PREGNANCY: hCG, Beta Chain, Quant, S: <1 m[IU]/mL (ref <5)

## 2020-08-05 MED ORDER — ONDANSETRON HCL 4 MG/2ML IJ SOLN
4.0000 mg | Freq: Once | INTRAMUSCULAR | Status: AC
  Administered 2020-08-05: 4 mg via INTRAVENOUS
  Filled 2020-08-05: qty 2

## 2020-08-05 MED ORDER — ONDANSETRON 4 MG PO TBDP: ORAL_TABLET | ORAL | 0 refills | Status: DC

## 2020-08-05 MED ORDER — CEFTRIAXONE SODIUM 500 MG IJ SOLR
500.0000 mg | Freq: Once | INTRAMUSCULAR | Status: AC
  Administered 2020-08-05: 500 mg via INTRAMUSCULAR
  Filled 2020-08-05: qty 500

## 2020-08-05 MED ORDER — OXYCODONE-ACETAMINOPHEN 5-325 MG PO TABS: 2.0000 | ORAL_TABLET | Freq: Four times a day (QID) | ORAL | 0 refills | Status: DC | PRN

## 2020-08-05 MED ORDER — HYDROMORPHONE HCL 1 MG/ML IJ SOLN
0.5000 mg | Freq: Once | INTRAMUSCULAR | Status: AC
Start: 2020-08-05 — End: 2020-08-05
  Administered 2020-08-05: 0.5 mg via INTRAVENOUS
  Filled 2020-08-05: qty 1

## 2020-08-05 MED ORDER — IOHEXOL 300 MG/ML  SOLN
100.0000 mL | Freq: Once | INTRAMUSCULAR | Status: AC | PRN
  Administered 2020-08-05: 100 mL via INTRAVENOUS

## 2020-08-05 MED ORDER — AZITHROMYCIN 250 MG PO TABS
1000.0000 mg | ORAL_TABLET | Freq: Once | ORAL | Status: AC
  Administered 2020-08-05: 1000 mg via ORAL
  Filled 2020-08-05: qty 4

## 2020-08-05 MED ORDER — SODIUM CHLORIDE 0.9 % IV BOLUS (SEPSIS)
1000.0000 mL | Freq: Once | INTRAVENOUS | Status: AC
  Administered 2020-08-05: 1000 mL via INTRAVENOUS

## 2020-08-05 NOTE — Discharge Instructions (Addendum)
Follow up with Dr. Despina Hidden or one of his partners resistance this week for recheck

## 2020-08-05 NOTE — ED Provider Notes (Signed)
[facility] EMERGENCY DEPARTMENT Provider Note   CSN: [id number] Arrival date & time: 08/05/20  1153     History Chief Complaint  Patient presents with  . Emesis    Lisa Cervantes is a 20 y.o. female.  Patient states she is having lower abdominal pain and she is on her period now.  No fever no vomiting  The history is provided by the patient and medical records. No language interpreter was used.  Abdominal Pain Pain location:  Suprapubic Pain quality: aching   Pain radiates to:  Does not radiate Pain severity:  Moderate Onset quality:  Sudden Timing:  Intermittent Progression:  Worsening Chronicity:  New Context: not alcohol use   Relieved by:  Nothing Associated symptoms: no chest pain, no cough, no diarrhea, no fatigue and no hematuria        Past Medical History:  Diagnosis Date  . ADHD (attention deficit hyperactivity disorder)   . Anxiety   . Depression   . Headache     Patient Active Problem List   Diagnosis Date Noted  . Unexplained weight loss 08/04/2020  . Gastroesophageal reflux disease without esophagitis 08/04/2020  . Allergies 03/23/2020  . Cough 03/23/2020  . Viral respiratory illness 12/30/2019  . Restless leg syndrome 12/04/2019  . Infertility, anovulation 09/17/2019  . Irregular periods/menstrual cycles 05/17/2019  . Anxiety 08/26/2017  . Psychophysiological insomnia 06/12/2017  . Acne vulgaris 09/23/2016  . Major depressive disorder, single episode, severe without psychotic features (HCC)   . Cannabis use disorder, mild, abuse   . Migraine headache without aura 12/20/2013  . Left knee pain 09/12/2013  . 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 Social anxiety disorder 08/02/2013    History reviewed. No pertinent surgical history.   OB History   No obstetric history on file.     Family History  Problem Relation Age of Onset  . Hyperlipidemia Maternal Grandmother   . Heart disease  Maternal Grandfather   . Alcohol abuse Father     Social History   Tobacco Use  . Smoking status: Current Every Day Smoker    Packs/day: 0.25    Types: Cigarettes  . Smokeless tobacco: Never Used  Vaping Use  . Vaping Use: Never used  Substance Use Topics  . Alcohol use: No  . Drug use: Yes    Frequency: 2.0 times per week    Types: Marijuana    Home Medications Prior to Admission medications   Medication Sig Start Date End Date Taking? Authorizing Provider  ondansetron (ZOFRAN ODT) 4 MG disintegrating tablet 4mg  ODT q4 hours prn nausea/vomit 08/05/20  Yes 08/07/20, MD  oxyCODONE-acetaminophen (PERCOCET) 5-325 MG tablet Take 2 tablets by mouth every 6 (six) hours as needed. 08/05/20  Yes 08/07/20, MD  BLACK ELDERBERRY PO Take by mouth. Takes 2 gummy's a day    [provider]  escitalopram (LEXAPRO) 10 MG tablet Take 1 tablet (10 mg total) by mouth daily. For anxiety 08/04/20 08/04/21  08/06/21, NP  fluticasone (FLONASE) 50 MCG/ACT nasal spray Place 2 sprays into both nostrils daily. Prn head congestion 06/09/20   06/11/20, NP  MULTIPLE VITAMIN PO Take by mouth. Patient takes 2 gummy vitamins a day    [provider]  pantoprazole (PROTONIX) 40 MG tablet Take 1 tablet (40 mg total) by mouth daily. For acid reflux 08/04/20   08/06/20, NP    Allergies    Lo loestrin fe [norethin  ace-eth estrad-fe]  Review of Systems   Review of Systems  Constitutional: Negative for appetite change and fatigue.  HENT: Negative for congestion, ear discharge and sinus pressure.   Eyes: Negative for discharge.  Respiratory: Negative for cough.   Cardiovascular: Negative for chest pain.  Gastrointestinal: Positive for abdominal pain. Negative for diarrhea.  Genitourinary: Negative for frequency and hematuria.  Musculoskeletal: Negative for back pain.  Skin: Negative for rash.  Neurological: Negative for seizures and headaches.   Psychiatric/Behavioral: Negative for hallucinations.    Physical Exam Updated Vital Signs BP 125/83 (BP Location: Left Arm)   Pulse 67   Temp 97.7 F (36.5 C) (Oral)   Resp 18   Ht 5\' 3"  (1.6 m)   Wt 53.5 kg   LMP 08/01/2020   SpO2 100%   BMI 20.90 kg/m   Physical Exam Vitals and nursing note reviewed.  Constitutional:      Appearance: She is well-developed.  HENT:     Head: Normocephalic.     Nose: Nose normal.  Eyes:     General: No scleral icterus.    Conjunctiva/sclera: Conjunctivae normal.  Neck:     Thyroid: No thyromegaly.  Cardiovascular:     Rate and Rhythm: Normal rate and regular rhythm.     Heart sounds: No murmur heard. No friction rub. No gallop.   Pulmonary:     Breath sounds: No stridor. No wheezing or rales.  Chest:     Chest wall: No tenderness.  Abdominal:     General: There is no distension.     Tenderness: There is abdominal tenderness. There is no rebound.  Genitourinary:    Comments: Mildly tender cervix and adnexal area no discharge Musculoskeletal:        General: Normal range of motion.     Cervical back: Neck supple.  Lymphadenopathy:     Cervical: No cervical adenopathy.  Skin:    Findings: No erythema or rash.  Neurological:     Mental Status: [name], place, and time.     Motor: No abnormal muscle tone.     Coordination: Coordination normal.  Psychiatric:        Behavior: Behavior normal.     ED Results / Procedures / Treatments   Labs (all labs ordered are listed, but only abnormal results are displayed) Labs Reviewed  CBC WITH DIFFERENTIAL/PLATELET - Abnormal; Notable for the following components:      Result Value   WBC 17.2 (*)    Neutro Abs 15.0 (*)    Abs Immature Granulocytes 0.10 (*)    All other components within normal limits  COMPREHENSIVE METABOLIC PANEL - Abnormal; Notable for the following components:   CO2 19 (*)    Glucose, Bld 151 (*)    Total Protein 8.6 (*)    All  other components within normal limits  URINALYSIS, ROUTINE W REFLEX MICROSCOPIC - Abnormal; Notable for the following components:   Color, Urine AMBER (*)    APPearance CLOUDY (*)    Hgb urine dipstick MODERATE (*)    Ketones, ur 80 (*)    Protein, ur 100 (*)    RBC / HPF >50 (*)    Bacteria, UA RARE (*)    All other components within normal limits  URINE CULTURE  HCG, QUANTITATIVE, PREGNANCY  GC/CHLAMYDIA PROBE AMP ([place]) NOT AT [facility]    EKG None  Radiology CT ABDOMEN PELVIS W CONTRAST  Result Date: 08/05/2020 CLINICAL DATA:  Abdominal  distension EXAM: CT ABDOMEN AND PELVIS WITH CONTRAST TECHNIQUE: Multidetector CT imaging of the abdomen and pelvis was performed using the standard protocol following bolus administration of intravenous contrast. CONTRAST:  OMNIPAQUE IOHEXOL 300 MG/ML  SOLN COMPARISON:  None. FINDINGS: Lower chest: No acute abnormality. Hepatobiliary: No solid liver abnormality is seen. No gallstones, gallbladder wall thickening, or biliary dilatation. Pancreas: Unremarkable. No pancreatic ductal dilatation or surrounding inflammatory changes. Spleen: Normal in size without significant abnormality. Adrenals/Urinary Tract: Adrenal glands are unremarkable. Kidneys are normal, without renal calculi, solid lesion, or hydronephrosis. Bladder is unremarkable. Stomach/Bowel: Stomach is within normal limits. Appendix appears normal. No evidence of bowel wall thickening, distention, or inflammatory changes. Vascular/Lymphatic: No significant vascular findings are present. No enlarged abdominal or pelvic lymph nodes. Reproductive: Nabothian cysts of the cervix. Functional cysts and follicles bilaterally. Other: No abdominal wall hernia or abnormality. Trace, nonspecific free fluid in the low pelvis. Musculoskeletal: No acute or significant osseous findings. IMPRESSION: 1. No acute CT findings of the abdomen or pelvis to explain abdominal distension. 2. Trace, nonspecific free  fluid in the low pelvis, likely functional in the reproductive age setting. Electronically Signed   By: Lauralyn Primes M.D.   On: 08/05/2020 14:33    Procedures Procedures   Medications Ordered in ED Medications  HYDROmorphone (DILAUDID) injection 0.5 mg (has no administration in time range)  cefTRIAXone (ROCEPHIN) injection 500 mg (has no administration in time range)  azithromycin (ZITHROMAX) tablet 1,000 mg (has no administration in time range)  sodium chloride 0.9 % bolus 1,000 mL (1,000 mLs Intravenous New Bag/Given 08/05/20 1259)  HYDROmorphone (DILAUDID) injection 0.5 mg (0.5 mg Intravenous Given 08/05/20 1300)  ondansetron (ZOFRAN) injection 4 mg (4 mg Intravenous Given 08/05/20 1300)  iohexol (OMNIPAQUE) 300 MG/ML solution 100 mL (100 mLs Intravenous Contrast Given 08/05/20 1408)  ondansetron (ZOFRAN) injection 4 mg (4 mg Intravenous Given 08/05/20 1515)    ED Course  I have reviewed the triage vital signs and the nursing notes.  Pertinent labs & imaging results that were available during my care of the patient were reviewed by me and considered in my medical decision making (see chart for details).    MDM Rules/Calculators/A&P                          Patient with pelvic pain .Marland Kitchen  CT scan was negative.  Pelvic exam shows some blood and tenderness.  She will be treated for an STD and referred to OB/GYN Final Clinical Impression(s) / ED Diagnoses Final diagnoses:  Pelvic pain    Rx / DC Orders ED Discharge Orders         Ordered    ondansetron (ZOFRAN ODT) 4 MG disintegrating tablet        08/05/20 1514    oxyCODONE-acetaminophen (PERCOCET) 5-325 MG tablet  Every 6 hours PRN        08/05/20 1514           Bethann Berkshire, MD 08/08/20 [id number]

## 2020-08-05 NOTE — ED Triage Notes (Signed)
Pt states waking up this morning with lower abdominal cramping, vaginal bleeding and n/v/d. Pt is currently on her menstrual cycle.

## 2020-08-07 ENCOUNTER — Telehealth: Payer: Self-pay | Admitting: *Deleted

## 2020-08-07 ENCOUNTER — Other Ambulatory Visit (INDEPENDENT_AMBULATORY_CARE_PROVIDER_SITE_OTHER): Payer: Self-pay | Admitting: Internal Medicine

## 2020-08-07 DIAGNOSIS — N979 Female infertility, unspecified: Secondary | ICD-10-CM

## 2020-08-07 LAB — URINE CULTURE: Culture: <10000 — AB

## 2020-08-07 LAB — GC/CHLAMYDIA PROBE AMP (~~LOC~~) NOT AT ARMC
Chlamydia: NEGATIVE
Comment: NEGATIVE
Comment: NORMAL
Neisseria Gonorrhea: NEGATIVE

## 2020-08-07 NOTE — Telephone Encounter (Signed)
Transition Care Management Follow-up Telephone Call  Date of discharge and from where: 08/05/2020 - [facility]  How have you been since you were released from the hospital? "About the same"  Any questions or concerns? No  Items Reviewed:  Did the pt receive and understand the discharge instructions provided? Yes   Medications obtained and verified? Yes   Other? No   Any new allergies since your discharge? No   Dietary orders reviewed? No  Do you have support at home? Yes   Home Care and Equipment/Supplies: Were home health services ordered? not applicable If so, what is the name of the agency? N/A  Has the agency set up a time to come to the patient's home? not applicable Were any new equipment or medical supplies ordered?  No What is the name of the medical supply agency? N/A Were you able to get the supplies/equipment? not applicable Do you have any questions related to the use of the equipment or supplies? No  Functional Questionnaire: (I = Independent and D = Dependent) ADLs: I  Bathing/Dressing- I  Meal Prep- I  Eating- I  Maintaining continence- I  Transferring/Ambulation- I  Managing Meds- I  Follow up appointments reviewed:   PCP Hospital f/u appt confirmed? No    Specialist Hospital f/u appt confirmed? No  - was referred to [facility]  Are transportation arrangements needed? No   If their condition worsens, is the pt aware to call PCP or go to the Emergency Dept.? Yes  Was the patient provided with contact information for the PCP's office or ED? Yes  Was to pt encouraged to call back with questions or concerns? Yes

## 2020-08-22 DIAGNOSIS — R112 Nausea with vomiting, unspecified: Secondary | ICD-10-CM | POA: Diagnosis not present

## 2020-08-22 DIAGNOSIS — R1084 Generalized abdominal pain: Secondary | ICD-10-CM | POA: Diagnosis not present

## 2020-08-23 ENCOUNTER — Telehealth: Payer: Self-pay | Admitting: Nurse Practitioner

## 2020-08-23 DIAGNOSIS — Z5181 Encounter for therapeutic drug level monitoring: Secondary | ICD-10-CM | POA: Diagnosis not present

## 2020-08-23 DIAGNOSIS — R1084 Generalized abdominal pain: Secondary | ICD-10-CM

## 2020-08-23 DIAGNOSIS — F3162 Bipolar disorder, current episode mixed, moderate: Secondary | ICD-10-CM | POA: Diagnosis not present

## 2020-08-23 DIAGNOSIS — G4700 Insomnia, unspecified: Secondary | ICD-10-CM | POA: Diagnosis not present

## 2020-08-23 DIAGNOSIS — F411 Generalized anxiety disorder: Secondary | ICD-10-CM | POA: Diagnosis not present

## 2020-08-23 DIAGNOSIS — Z79899 Other long term (current) drug therapy: Secondary | ICD-10-CM | POA: Diagnosis not present

## 2020-08-23 NOTE — Telephone Encounter (Signed)
Is this our pt. Has dr Karilyn Cota has her pcp

## 2020-08-23 NOTE — Telephone Encounter (Signed)
Patient was seen at [facility] today by Michael Litter and was requesting a G. I referral due to reoccurring abdominal pains. So they called here for her primary care to send in referral. [phone] if you have any questions.

## 2020-08-28 ENCOUNTER — Ambulatory Visit: Payer: Medicaid Other | Admitting: Adult Health

## 2020-08-30 DIAGNOSIS — F902 Attention-deficit hyperactivity disorder, combined type: Secondary | ICD-10-CM | POA: Diagnosis not present

## 2020-08-30 NOTE — Telephone Encounter (Signed)
Please advise. Thank you

## 2020-08-30 NOTE — Telephone Encounter (Signed)
She states still our patient will get with Lisa Cervantes

## 2020-08-31 NOTE — Telephone Encounter (Signed)
Referral ordered in Epic. 

## 2020-08-31 NOTE — Telephone Encounter (Signed)
That is odd that Karilyn Cota is listed as primary. She must have been seen there for other issues. She is still seen at our office. Please refer to GI as requested. Thanks.

## 2020-09-01 ENCOUNTER — Encounter (INDEPENDENT_AMBULATORY_CARE_PROVIDER_SITE_OTHER): Payer: Self-pay | Admitting: *Deleted

## 2020-09-06 DIAGNOSIS — F411 Generalized anxiety disorder: Secondary | ICD-10-CM | POA: Diagnosis not present

## 2020-09-06 DIAGNOSIS — F3162 Bipolar disorder, current episode mixed, moderate: Secondary | ICD-10-CM | POA: Diagnosis not present

## 2020-09-06 DIAGNOSIS — F902 Attention-deficit hyperactivity disorder, combined type: Secondary | ICD-10-CM | POA: Diagnosis not present

## 2020-09-06 DIAGNOSIS — G4700 Insomnia, unspecified: Secondary | ICD-10-CM | POA: Diagnosis not present

## 2020-09-07 ENCOUNTER — Ambulatory Visit (INDEPENDENT_AMBULATORY_CARE_PROVIDER_SITE_OTHER): Payer: Medicaid Other | Admitting: Internal Medicine

## 2020-09-11 NOTE — Progress Notes (Deleted)
   GYN VISIT Patient name: Lisa Cervantes MRN [id number]  Date of birth: [birth year] Chief Complaint:   No chief complaint on file.  History of Present Illness:   Lisa Cervantes is a 20 y.o. G0 female being seen today for follow up from recent ER visit 08/05/2020.  Today she notes *** ER records reviewed- Abd CT negative.  No LMP recorded. (Menstrual status: Irregular Periods).  Depression screen [facility] 2/9 08/04/2020 07/31/2020 03/23/2020 07/28/2018 06/09/2017  Decreased Interest 2 0 1 2 2   Down, Depressed, Hopeless 0 0 0 3 2  PHQ - 2 Score 2 0 1 5 4   Altered sleeping 3 0 3 3 2   Tired, decreased energy 3 0 1 3 2   Change in appetite 1 0 0 3 2  Feeling bad or failure about yourself  0 0 0 2 1  Trouble concentrating 1 0 1 1 1   Moving slowly or fidgety/restless 1 0 3 2 0  Suicidal thoughts 0 0 0 3 0  PHQ-9 Score 11 0 9 22 12   Difficult doing work/chores Very difficult Not difficult at all Somewhat difficult Very difficult -     Review of Systems:   Pertinent items are noted in HPI Denies fever/chills, dizziness, headaches, visual disturbances, fatigue, shortness of breath, chest pain, abdominal pain, vomiting, *** problems with periods, bowel movements, urination, or intercourse unless otherwise stated above.  Pertinent History Reviewed:  Reviewed past medical,surgical, social, obstetrical and family history.  Reviewed problem list, medications and allergies. Physical Assessment:  There were no vitals filed for this visit.There is no height or weight on file to calculate BMI.       Physical Examination:   General appearance: alert, well appearing, and in no distress  Psych: mood appropriate, normal affect  Skin: warm & dry   Cardiovascular: normal heart rate noted  Respiratory: normal respiratory effort, no distress  Abdomen: soft, non-tender   Pelvic: {pelvic exam:315900::"normal external genitalia, vulva, vagina, cervix, uterus and adnexa"}  Extremities: no edema    Chaperone: {Chaperone:[id number]::"N/A","Latisha Cresenzo","Janet Young","Amanda Andrews","Peggy Dones","Nicole Jones","Angel Neas"}    Assessment & Plan:  1) ***> ***  2) ***> ***  No orders of the defined types were placed in this encounter.   No follow-ups on file.   , DO Attending Obstetrician & Gynecologist, [facility] for , [facility]

## 2020-09-12 ENCOUNTER — Ambulatory Visit: Payer: Medicaid Other | Admitting: Women's Health

## 2020-09-13 ENCOUNTER — Ambulatory Visit: Payer: Medicaid Other | Admitting: Obstetrics & Gynecology

## 2020-09-30 ENCOUNTER — Encounter: Payer: Self-pay | Admitting: Nurse Practitioner

## 2020-10-05 DIAGNOSIS — F902 Attention-deficit hyperactivity disorder, combined type: Secondary | ICD-10-CM | POA: Diagnosis not present

## 2020-10-05 DIAGNOSIS — F3162 Bipolar disorder, current episode mixed, moderate: Secondary | ICD-10-CM | POA: Diagnosis not present

## 2020-10-05 DIAGNOSIS — F411 Generalized anxiety disorder: Secondary | ICD-10-CM | POA: Diagnosis not present

## 2020-10-05 DIAGNOSIS — G4700 Insomnia, unspecified: Secondary | ICD-10-CM | POA: Diagnosis not present

## 2020-10-13 NOTE — Telephone Encounter (Signed)
See what you think. We are not listed as primary. Thanks.

## 2020-10-19 ENCOUNTER — Other Ambulatory Visit: Payer: Self-pay | Admitting: Nurse Practitioner

## 2020-10-19 ENCOUNTER — Encounter: Payer: Self-pay | Admitting: Nurse Practitioner

## 2020-10-19 DIAGNOSIS — N926 Irregular menstruation, unspecified: Secondary | ICD-10-CM

## 2020-10-20 ENCOUNTER — Other Ambulatory Visit: Payer: Self-pay | Admitting: Nurse Practitioner

## 2020-10-20 MED ORDER — VALACYCLOVIR HCL 1 G PO TABS
ORAL_TABLET | ORAL | 0 refills | Status: DC
Start: 2020-10-20 — End: 2020-11-06

## 2020-10-22 ENCOUNTER — Encounter: Payer: Self-pay | Admitting: Nurse Practitioner

## 2020-10-24 ENCOUNTER — Telehealth: Payer: Medicaid Other | Admitting: Family Medicine

## 2020-11-06 ENCOUNTER — Encounter: Payer: Self-pay | Admitting: Family Medicine

## 2020-11-06 ENCOUNTER — Other Ambulatory Visit: Payer: Self-pay

## 2020-11-06 ENCOUNTER — Ambulatory Visit (INDEPENDENT_AMBULATORY_CARE_PROVIDER_SITE_OTHER): Payer: Medicaid Other | Admitting: Family Medicine

## 2020-11-06 VITALS — BP 106/70 | HR 86 | Temp 98.1°F | Ht 63.0 in | Wt 117.0 lb

## 2020-11-06 DIAGNOSIS — N970 Female infertility associated with anovulation: Secondary | ICD-10-CM

## 2020-11-06 DIAGNOSIS — N912 Amenorrhea, unspecified: Secondary | ICD-10-CM | POA: Diagnosis not present

## 2020-11-06 LAB — POCT URINE PREGNANCY: Preg Test, Ur: NEGATIVE

## 2020-11-06 NOTE — Progress Notes (Signed)
Patient ID: Lisa Cervantes, female    DOB: February 05, 2001, 20 y.o.   MRN: [id number]   Chief Complaint  Patient presents with   Amenorrhea   Subjective:    HPI CC- missed periods.  Last menstrual cycle was April 25th and ended on 29th. Did not have what pt calls a period in May only spotting from may 16th - 22nd. And no bleeding since then. Took pregnancy test on June 19th and it was negative. Pt is having breast pain, feeling really sleepy and some weight gain. Feels like she may be pregnant and requesting a blood pregnancy test. Pt wants to figure out if she is pregnant and if not what is causing her to miss cycle. She is not interested in birth control.   Pt was on bc in the past- regular. Then stopped bc- 2.5 yrs ago. Some times having one, irregular.  Dx with anovulation. Used to have them monthly till this past April.  Was on methylphenidate ER- for add/adhd.  Saw a psychiatrist in [place] and they gave this medication. Having unprotected intercourse. Side effects with birth control, pills, shots and lost hair.  Not wanting to be on oral bcp.  Was on clomid in the past trying to get pregnant. Had some weight gain recently and usually having weight loss, referred to GI. Working her up for pcos  Results for orders placed or performed in visit on 11/06/20  POCT urine pregnancy  Result Value Ref Range   Preg Test, Ur Negative Negative        Medical History Lisa Cervantes has a past medical history of ADHD (attention deficit hyperactivity disorder), Anxiety, Depression, and Headache.   Outpatient Encounter Medications as of 11/06/2020  Medication Sig   BLACK ELDERBERRY PO Take by mouth. Takes 2 gummy's a day   fluticasone (FLONASE) 50 MCG/ACT nasal spray Place 2 sprays into both nostrils daily. Prn head congestion   MULTIPLE VITAMIN PO Take by mouth. Patient takes 2 gummy vitamins a day   ondansetron (ZOFRAN ODT) 4 MG disintegrating tablet 4mg  ODT q4 hours prn nausea/vomit    [DISCONTINUED] pantoprazole (PROTONIX) 40 MG tablet Take 1 tablet (40 mg total) by mouth daily. For acid reflux   [DISCONTINUED] valACYclovir (VALTREX) 1000 MG tablet Take 2 tablets (2000 mg) po q 12 hours x 2 doses for fever blisters   [DISCONTINUED] escitalopram (LEXAPRO) 10 MG tablet Take 1 tablet (10 mg total) by mouth daily. For anxiety   [DISCONTINUED] oxyCODONE-acetaminophen (PERCOCET) 5-325 MG tablet Take 2 tablets by mouth every 6 (six) hours as needed.   No facility-administered encounter medications on file as of 11/06/2020.     Review of Systems  Constitutional:  Negative for chills and fever.  HENT:  Negative for congestion, rhinorrhea and sore throat.   Respiratory:  Negative for cough, shortness of breath and wheezing.   Cardiovascular:  Negative for chest pain and leg swelling.  Gastrointestinal:  Positive for nausea. Negative for abdominal pain, diarrhea and vomiting.  Genitourinary:  Positive for menstrual problem (irregular periods). Negative for dysuria, frequency, vaginal bleeding, vaginal discharge and vaginal pain.  Musculoskeletal:  Negative for arthralgias and back pain.  Skin:  Negative for rash.  Neurological:  Negative for dizziness, weakness and headaches.    Vitals BP 106/70   Pulse 86   Temp 98.1 F (36.7 C)   Ht 5\' 3"  (1.6 m)   Wt 117 lb (53.1 kg)   BMI 20.73 kg/m   Objective:   Physical Exam Vitals  and nursing note reviewed.  Constitutional:      Appearance: Normal appearance.  HENT:     Head: Normocephalic and atraumatic.     Nose: Nose normal.     Mouth/Throat:     Mouth: Mucous membranes are moist.     Pharynx: Oropharynx is clear.  Eyes:     Extraocular Movements: Extraocular movements intact.     Conjunctiva/sclera: Conjunctivae normal.     Pupils: Pupils are equal, round, and reactive to light.  Cardiovascular:     Rate and Rhythm: Normal rate and regular rhythm.     Pulses: Normal pulses.     Heart sounds: Normal heart sounds.   Pulmonary:     Effort: Pulmonary effort is normal.     Breath sounds: Normal breath sounds. No wheezing, rhonchi or rales.  Genitourinary:    Comments: Pt declining Musculoskeletal:        General: Normal range of motion.     Right lower leg: No edema.     Left lower leg: No edema.  Skin:    General: Skin is warm and dry.     Findings: No lesion or rash.  Neurological:     General: No focal deficit present.     Mental Status: [name], place, and time.  Psychiatric:        Mood and Affect: Mood normal.        Behavior: Behavior normal.     Assessment and Plan   1. Amenorrhea - POCT urine pregnancy - B-HCG Quant - Ambulatory referral to Obstetrics / Gynecology  2. Infertility associated with anovulation - Ambulatory referral to Obstetrics / Gynecology  Pt wanting referral to infertility clinic.  Pt requesting bhcg blood testing.  Pt declining pelvic exam, wanting to see gyn.  Urine preg today-negative.  Return if symptoms worsen or fail to improve.  11/06/2020

## 2020-11-07 LAB — HCG, SERUM, QUALITATIVE: hCG,Beta Subunit,Qual,Serum: NEGATIVE m[IU]/mL (ref <6)

## 2020-11-14 ENCOUNTER — Encounter: Payer: Self-pay | Admitting: Nurse Practitioner

## 2020-11-22 ENCOUNTER — Encounter: Payer: Self-pay | Admitting: Nurse Practitioner

## 2020-11-24 ENCOUNTER — Other Ambulatory Visit: Payer: Self-pay | Admitting: Nurse Practitioner

## 2020-11-24 DIAGNOSIS — N926 Irregular menstruation, unspecified: Secondary | ICD-10-CM

## 2020-11-24 DIAGNOSIS — N970 Female infertility associated with anovulation: Secondary | ICD-10-CM

## 2020-12-28 ENCOUNTER — Ambulatory Visit (INDEPENDENT_AMBULATORY_CARE_PROVIDER_SITE_OTHER): Payer: Medicaid Other | Admitting: Gastroenterology

## 2021-01-30 ENCOUNTER — Other Ambulatory Visit: Payer: Self-pay | Admitting: Nurse Practitioner

## 2021-01-30 ENCOUNTER — Encounter: Payer: Self-pay | Admitting: Nurse Practitioner

## 2021-01-30 MED ORDER — PENICILLIN V POTASSIUM 500 MG PO TABS: 500.0000 mg | ORAL_TABLET | Freq: Three times a day (TID) | ORAL | 0 refills | Status: AC

## 2021-02-06 ENCOUNTER — Encounter: Payer: Self-pay | Admitting: Nurse Practitioner

## 2021-02-07 ENCOUNTER — Other Ambulatory Visit: Payer: Self-pay | Admitting: Nurse Practitioner

## 2021-02-07 MED ORDER — TERCONAZOLE 0.4 % VA CREA: 1.0000 | TOPICAL_CREAM | Freq: Every day | VAGINAL | 0 refills | Status: DC

## 2021-02-09 ENCOUNTER — Encounter: Payer: Self-pay | Admitting: Nurse Practitioner

## 2021-02-09 ENCOUNTER — Other Ambulatory Visit: Payer: Self-pay | Admitting: Nurse Practitioner

## 2021-03-08 DIAGNOSIS — Z3169 Encounter for other general counseling and advice on procreation: Secondary | ICD-10-CM | POA: Diagnosis not present

## 2021-03-08 DIAGNOSIS — N926 Irregular menstruation, unspecified: Secondary | ICD-10-CM | POA: Diagnosis not present

## 2021-03-12 ENCOUNTER — Encounter: Payer: Self-pay | Admitting: Nurse Practitioner

## 2021-04-19 ENCOUNTER — Encounter (INDEPENDENT_AMBULATORY_CARE_PROVIDER_SITE_OTHER): Payer: Self-pay | Admitting: Gastroenterology

## 2021-04-19 ENCOUNTER — Ambulatory Visit (INDEPENDENT_AMBULATORY_CARE_PROVIDER_SITE_OTHER): Payer: Medicaid Other | Admitting: Gastroenterology

## 2021-06-11 IMAGING — CT CT ABD-PELV W/ CM
2 of 4 series · 16 of 46 positions shown, 18 images · IV contrast (Omnipaque or Isovue)
Comparison: None.

CLINICAL DATA: Abdominal distension

EXAM:
CT ABDOMEN AND PELVIS WITH CONTRAST
TECHNIQUE: Multidetector CT imaging of the abdomen and pelvis was performed
using the standard protocol following bolus administration of
intravenous contrast.
CONTRAST:  100mL OMNIPAQUE IOHEXOL 300 MG/ML  SOLN

[Series 2: axial st · axial · 0.87mm/px · z∈[+1319,+1729]mm · 13 of 92 slices shown, 15 images]
[im 5/92  soft-tissue]
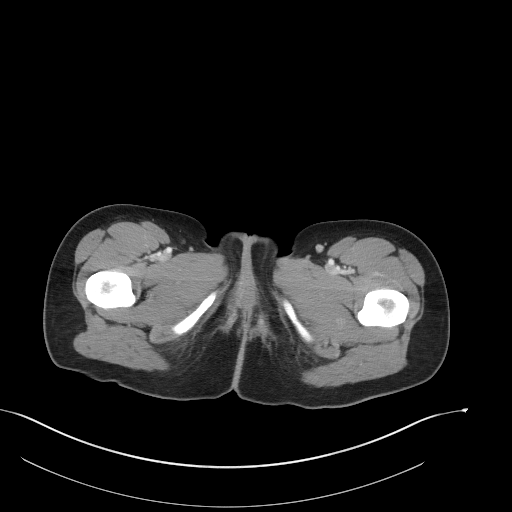
[im 5/92  bone]
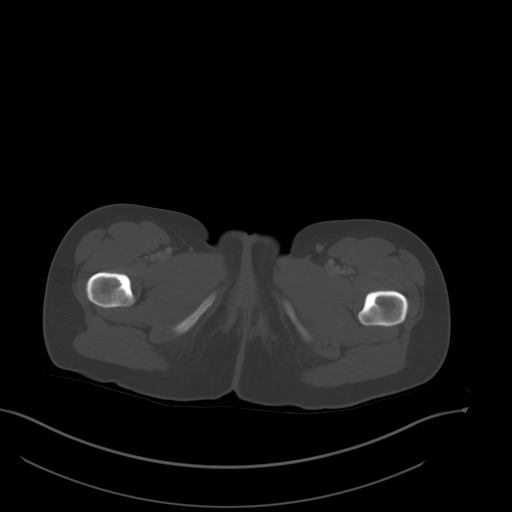
[im 13/92  soft-tissue]
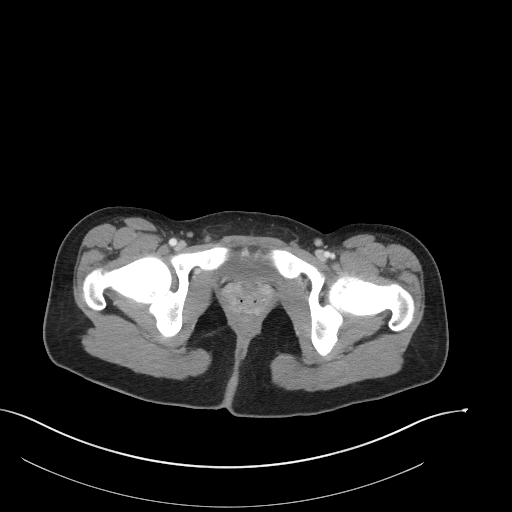
[im 21/92  soft-tissue]
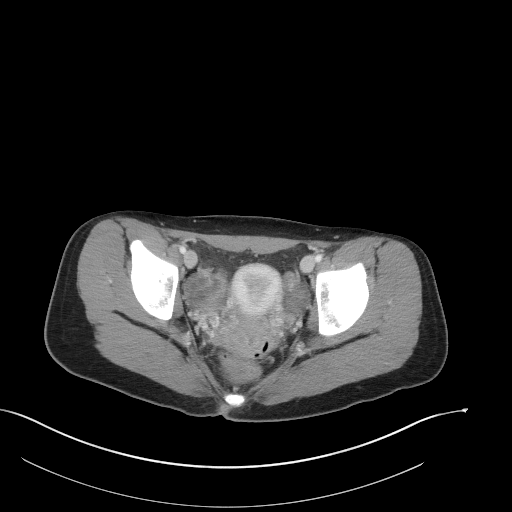
[im 25/92  soft-tissue]
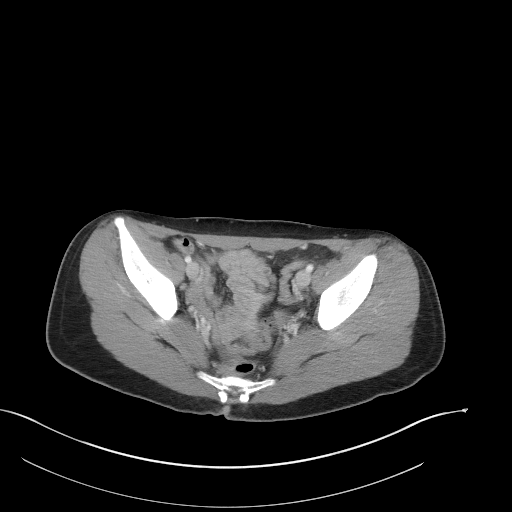
[im 34/92  soft-tissue]
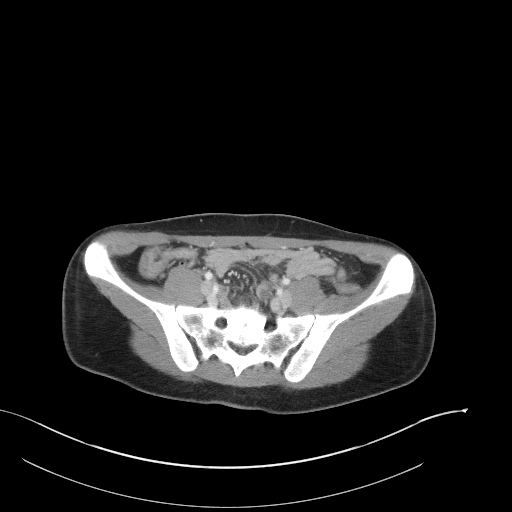
[im 38/92  soft-tissue]
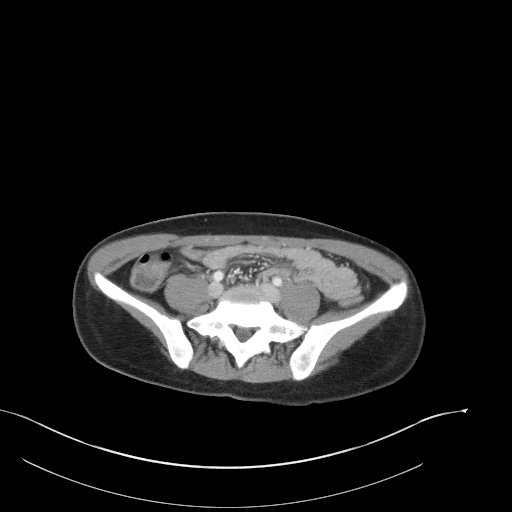
[im 46/92  soft-tissue]
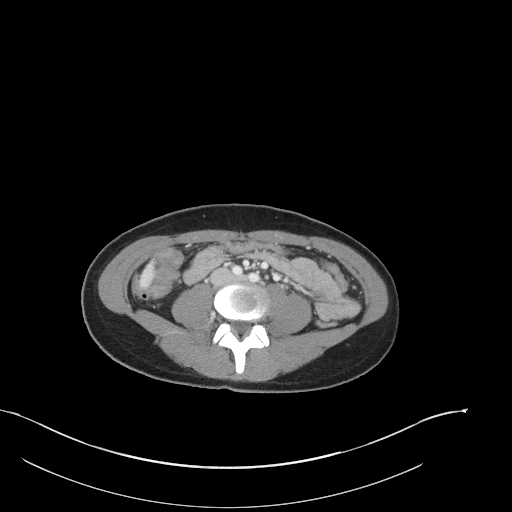
[im 54/92  soft-tissue]
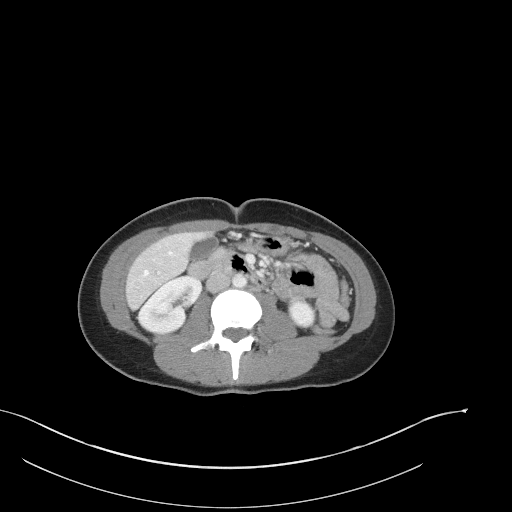
[im 58/92  soft-tissue]
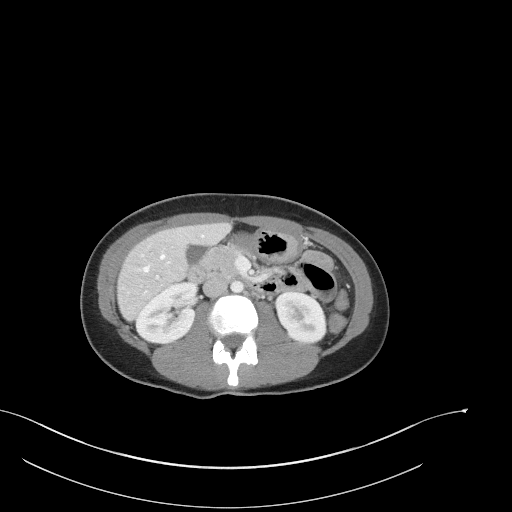
[im 58/92  bone]
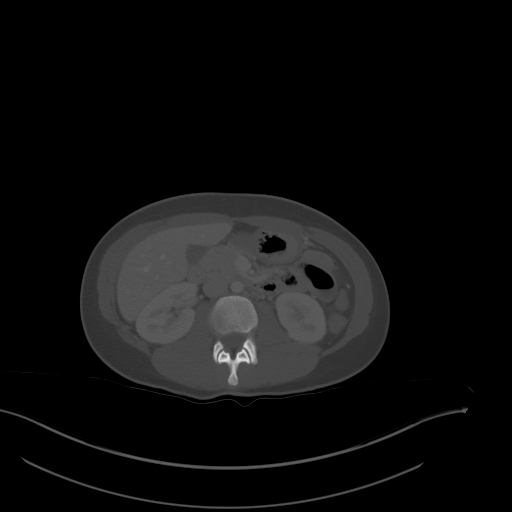
[im 67/92  soft-tissue]
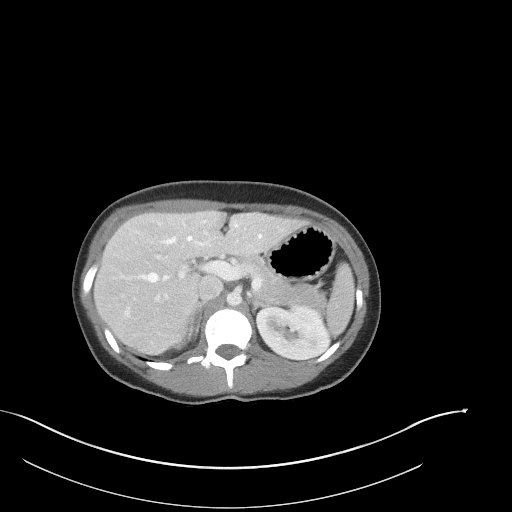
[im 71/92  soft-tissue]
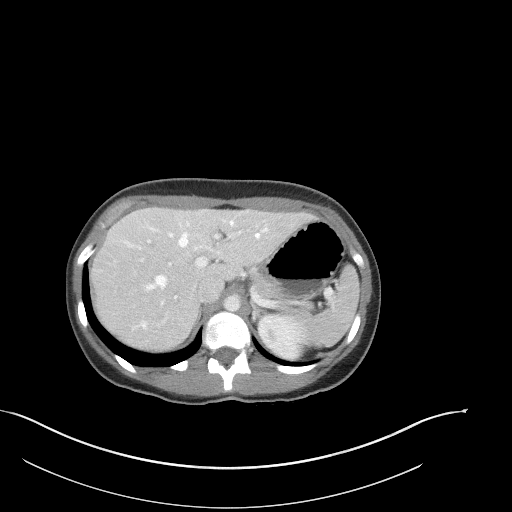
[im 79/92  soft-tissue]
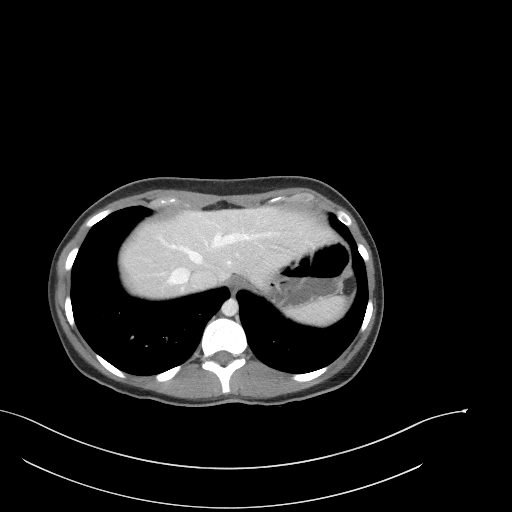
[im 87/92  soft-tissue]
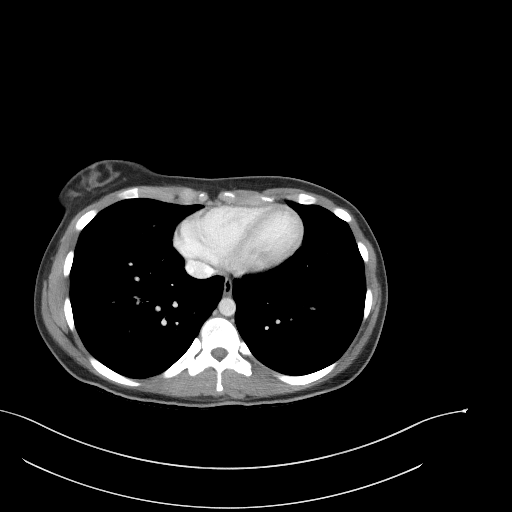

[Series 5: coronal st · coronal · 0.80mm/px · 3 of 89 slices shown]
[im 30/89  soft-tissue]
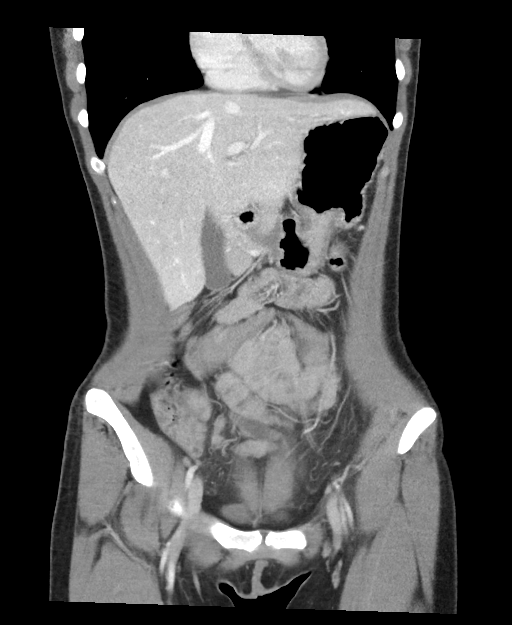
[im 40/89  soft-tissue]
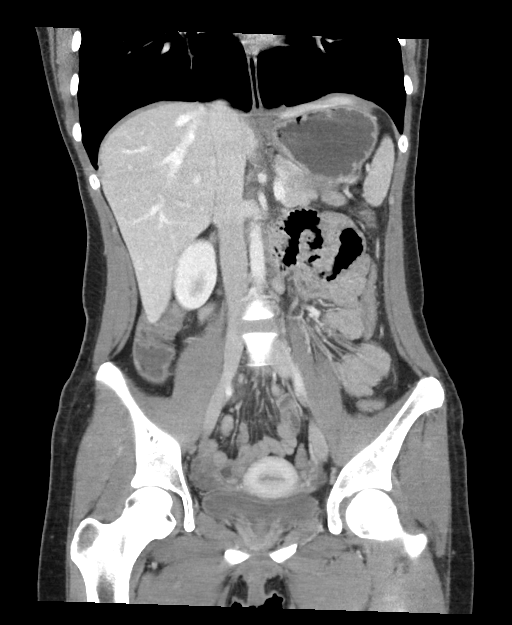
[im 49/89  soft-tissue]
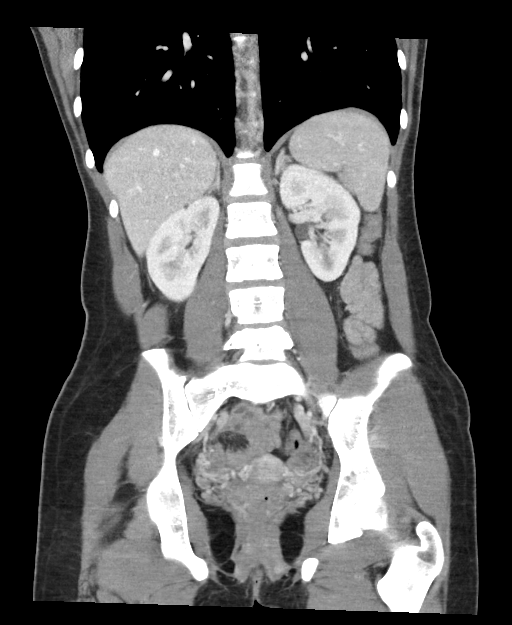

[16 of 46 positions shown; findings below may reference images not displayed]

FINDINGS: Lower chest: No acute abnormality.

Hepatobiliary: No solid liver abnormality is seen. No gallstones,
gallbladder wall thickening, or biliary dilatation.

Pancreas: Unremarkable. No pancreatic ductal dilatation or
surrounding inflammatory changes.

Spleen: Normal in size without significant abnormality.

Adrenals/Urinary Tract: Adrenal glands are unremarkable. Kidneys are
normal, without renal calculi, solid lesion, or hydronephrosis.
Bladder is unremarkable.

Stomach/Bowel: Stomach is within normal limits. Appendix appears
normal. No evidence of bowel wall thickening, distention, or
inflammatory changes.

Vascular/Lymphatic: No significant vascular findings are present. No
enlarged abdominal or pelvic lymph nodes.

Reproductive: Nabothian cysts of the cervix. Functional cysts and
follicles bilaterally.

Other: No abdominal wall hernia or abnormality. Trace, nonspecific
free fluid in the low pelvis.

Musculoskeletal: No acute or significant osseous findings.
IMPRESSION: 1. No acute CT findings of the abdomen or pelvis to explain
abdominal distension.
2. Trace, nonspecific free fluid in the low pelvis, likely
functional in the reproductive age setting.

## 2021-07-06 ENCOUNTER — Telehealth: Payer: Medicaid Other | Admitting: Nurse Practitioner

## 2021-07-06 ENCOUNTER — Encounter: Payer: Self-pay | Admitting: Family Medicine

## 2021-07-06 ENCOUNTER — Ambulatory Visit: Payer: Medicaid Other | Admitting: Family Medicine

## 2021-07-13 ENCOUNTER — Ambulatory Visit: Payer: Medicaid Other | Admitting: Family Medicine

## 2021-07-13 ENCOUNTER — Encounter: Payer: Self-pay | Admitting: Family Medicine

## 2021-07-16 ENCOUNTER — Encounter: Payer: Self-pay | Admitting: Family Medicine

## 2021-07-16 ENCOUNTER — Other Ambulatory Visit: Payer: Self-pay

## 2021-07-16 ENCOUNTER — Ambulatory Visit (INDEPENDENT_AMBULATORY_CARE_PROVIDER_SITE_OTHER): Payer: Medicaid Other | Admitting: Family Medicine

## 2021-07-16 DIAGNOSIS — F4321 Adjustment disorder with depressed mood: Secondary | ICD-10-CM | POA: Diagnosis not present

## 2021-07-16 MED ORDER — SERTRALINE HCL 50 MG PO TABS: 50.0000 mg | ORAL_TABLET | Freq: Every day | ORAL | 1 refills | Status: DC

## 2021-07-16 NOTE — Patient Instructions (Addendum)
Consider talking to someone or allowing me to refer you to a counselor. ? ?Start the medication as prescribed. ? ?Follow up with Eber Jones in 4-6 weeks ? ?Take care ? ?Dr. Adriana Simas  ?

## 2021-07-16 NOTE — Progress Notes (Signed)
? ?Subjective:  ?Patient ID: Lisa Cervantes, female    DOB: 09/14/2000  Age: 21 y.o. MRN: [id number] ? ?CC: ?Chief Complaint  ?Patient presents with  ? Anxiety  ?  Father passed away unexpectedly about one month ago. Pt states it is hard to function and pt is nervous to start back to school.   ? ? ?HPI: ? ?21 year old female with depression, anxiety, cannabis use disorder presents for evaluation of the above. ? ?Patient reports that she lost her father unexpectedly approximately 1 month ago.  She is having a lot of difficulty.  She states that she is finding it hard to function.  She feels like her anxiety is worsening.  PHQ-9 score of 14.  GAD-7 score of 18.  Patient states that she will be going back to school soon and states that she is not sure if she can do it.  Patient states that she has difficulty being around men as it reminds her of her father.  No reports of suicidal ideation.  ? ?Patient Active Problem List  ? Diagnosis Date Noted  ? Grief 07/16/2021  ? Gastroesophageal reflux disease without esophagitis 08/04/2020  ? Allergies 03/23/2020  ? Restless leg syndrome 12/04/2019  ? Irregular periods/menstrual cycles 05/17/2019  ? Anxiety 08/26/2017  ? Psychophysiological insomnia 06/12/2017  ? Major depressive disorder, single episode, severe without psychotic features (HCC)   ? Cannabis use disorder, mild, abuse   ? Migraine headache without aura 12/20/2013  ? Social anxiety disorder 08/02/2013  ? ? ?Social Hx   ?Social History  ? ?Socioeconomic History  ? Marital status: Single  ?  Spouse name: Not on file  ? Number of children: Not on file  ? Years of education: Not on file  ? Highest education level: Not on file  ?Occupational History  ? Not on file  ?Tobacco Use  ? Smoking status: Every Day  ?  Packs/day: 0.25  ?  Types: Cigarettes  ? Smokeless tobacco: Never  ?Vaping Use  ? Vaping Use: Never used  ?Substance and Sexual Activity  ? Alcohol use: No  ? Drug use: Yes  ?  Frequency: 2.0 times per week  ?   Types: Marijuana  ? Sexual activity: Yes  ?  Birth control/protection: None  ?Other Topics Concern  ? Not on file  ?Social History Narrative  ? Lives with fiancee(been together 2 years).Works at [employer].  ? ?Social Determinants of Health  ? ?Financial Resource Strain: Not on file  ?Food Insecurity: Not on file  ?Transportation Needs: Not on file  ?Physical Activity: Not on file  ?Stress: Not on file  ?Social Connections: Not on file  ? ? ?Review of Systems ?Per HPI ? ?Objective:  ?BP 103/70   Pulse 82   Temp 98.1 ?F (36.7 ?C)   Wt 124 lb 6.4 oz (56.4 kg)   SpO2 100%   BMI 22.04 kg/m?  ? ?BP/Weight 07/16/2021 11/06/2020 08/05/2020  ?Systolic BP 103 106 100  ?Diastolic BP 70 70 55  ?Wt. (Lbs) 124.4 117 118  ?BMI 22.04 20.73 20.9  ?Some encounter information is confidential and restricted. Go to Review Flowsheets activity to see all data.  ? ? ?Physical Exam ?Constitutional:   ?   General: [name]. ?   Appearance: Normal appearance. She is not ill-appearing.  ?HENT:  ?   Head: Normocephalic and atraumatic.  ?Cardiovascular:  ?   Rate and Rhythm: Normal rate and regular rhythm.  ?Pulmonary:  ?  Effort: Pulmonary effort is normal. No respiratory distress.  ?Neurological:  ?   Mental Status: She is alert.  ?Psychiatric:  ?   Comments: Flat affect.  Depressed mood.  ? ? ?Lab Results  ?Component Value Date  ? WBC 17.2 (H) 08/05/2020  ? HGB 14.2 08/05/2020  ? HCT 42.1 08/05/2020  ? PLT 333 08/05/2020  ? GLUCOSE 151 (H) 08/05/2020  ? CHOL 140 03/29/2014  ? TRIG 83 03/29/2014  ? HDL 44 03/29/2014  ? LDLCALC 79 03/29/2014  ? ALT 21 08/05/2020  ? AST 21 08/05/2020  ? NA 137 08/05/2020  ? K 3.5 08/05/2020  ? CL 105 08/05/2020  ? CREATININE 0.71 08/05/2020  ? BUN 10 08/05/2020  ? CO2 19 (L) 08/05/2020  ? TSH 0.57 07/31/2020  ? ? ? ?Assessment & Plan:  ? ?Problem List Items Addressed This Visit   ? ?  ? Other  ? Grief  ?  Patient has longstanding history of depression and anxiety.  Spearing seeing grief  at this time.  Patient does not want to see a therapist or counselor.  Placing on Zoloft. ?  ?  ? ? ?Meds ordered this encounter  ?Medications  ? sertraline (ZOLOFT) 50 MG tablet  ?  Sig: Take 1 tablet (50 mg total) by mouth daily.  ?  Dispense:  90 tablet  ?  Refill:  1  ? ? ?Follow-up:  Return in about 1 month (around 08/16/2021). ? ?Everlene Other DO ?[facility] ? ?

## 2021-07-16 NOTE — Assessment & Plan Note (Signed)
Patient has longstanding history of depression and anxiety.  Spearing seeing grief at this time.  Patient does not want to see a therapist or counselor.  Placing on Zoloft. ?

## 2021-09-28 ENCOUNTER — Ambulatory Visit (INDEPENDENT_AMBULATORY_CARE_PROVIDER_SITE_OTHER): Payer: Medicaid Other | Admitting: Nurse Practitioner

## 2021-09-28 ENCOUNTER — Encounter: Payer: Self-pay | Admitting: Nurse Practitioner

## 2021-09-28 VITALS — BP 110/71 | HR 80 | Temp 97.8°F | Ht 63.0 in | Wt 139.2 lb

## 2021-09-28 DIAGNOSIS — F419 Anxiety disorder, unspecified: Secondary | ICD-10-CM

## 2021-09-28 DIAGNOSIS — F5104 Psychophysiologic insomnia: Secondary | ICD-10-CM

## 2021-09-28 DIAGNOSIS — F4321 Adjustment disorder with depressed mood: Secondary | ICD-10-CM | POA: Diagnosis not present

## 2021-09-28 MED ORDER — DESVENLAFAXINE SUCCINATE ER 25 MG PO TB24
25.0000 mg | ORAL_TABLET | Freq: Every day | ORAL | 0 refills | Status: DC
Start: 2021-09-28 — End: 2021-11-25

## 2021-09-28 NOTE — Patient Instructions (Signed)
Managing Loss, Adult People experience loss in many different ways throughout their lives. Events such as moving, changing jobs, and losing friends can create a sense of loss. The loss may be as serious as a major health change, divorce, death of a pet, or death of a loved one. All of these types of loss are likely to create a physical and emotional reaction known as grief. Grief is the result of a major change or an absence of something or someone that you count on. Grief is a normal reaction to loss. A variety of factors can affect your grieving experience, including: The nature of your loss. Your relationship to what or whom you lost. Your understanding of grief and how to manage it. Your support system. Be aware that when grief becomes extreme, it can lead to more severe issues like isolation, depression, anxiety, or suicidal thoughts. Talk with your health care provider if you have any of these issues. How to manage lifestyle changes Keep to your normal routine as much as possible. If you have trouble focusing or doing normal activities, it is acceptable to take some time away from your normal routine. Spend time with friends and loved ones. Eat a healthy diet, get plenty of sleep, and rest when you feel tired. How to recognize changes  The way that you deal with your grief will affect your ability to function as you normally do. When grieving, you may experience these changes: Numbness, shock, sadness, anxiety, anger, denial, and guilt. Thoughts about death. Unexpected crying. A physical sensation of emptiness in your stomach. Problems sleeping and eating. Tiredness (fatigue). Loss of interest in normal activities. Dreaming about or imagining seeing the person who died. A need to remember what or whom you lost. Difficulty thinking about anything other than your loss for a period of time. Relief. If you have been expecting the loss for a while, you may feel a sense of relief when it  happens. Follow these instructions at home: Activity Express your feelings in healthy ways, such as: Talking with others about your loss. It may be helpful to find others who have had a similar loss, such as a support group. Writing down your feelings in a journal. Doing physical activities to release stress and emotional energy. Doing creative activities like painting, sculpting, or playing or listening to music. Practicing resilience. This is the ability to recover and adjust after facing challenges. Reading some resources that encourage resilience may help you to learn ways to practice those behaviors.  General instructions Be patient with yourself and others. Allow the grieving process to happen, and remember that grieving takes time. It is likely that you may never feel completely done with some grief. You may find a way to move on while still cherishing memories and feelings about your loss. Accepting your loss is a process. It can take months or longer to adjust. Keep all follow-up visits. This is important. Where to find support To get support for managing loss: Ask your health care provider for help and recommendations, such as grief counseling or therapy. Think about joining a support group for people who are managing a loss. Where to find more information You can find more information about managing loss from: American Society of Clinical Oncology: www.cancer.net American Psychological Association: www.apa.org Contact a health care provider if: Your grief is extreme and keeps getting worse. You have ongoing grief that does not improve. Your body shows symptoms of grief, such as illness. You feel depressed, anxious, or   hopeless. Get help right away if: You have thoughts about hurting yourself or others. Get help right away if you feel like you may hurt yourself or others, or have thoughts about taking your own life. Go to your nearest emergency room or: Call 911. Call the  National Suicide Prevention Lifeline at 1-800-273-8255 or 988. This is open 24 hours a day. Text the Crisis Text Line at 741741. Summary Grief is the result of a major change or an absence of someone or something that you count on. Grief is a normal reaction to loss. The depth of grief and the period of recovery depend on the type of loss and your ability to adjust to the change and process your feelings. Processing grief requires patience and a willingness to accept your feelings and talk about your loss with people who are supportive. It is important to find resources that work for you and to realize that people experience grief differently. There is not one grieving process that works for everyone in the same way. Be aware that when grief becomes extreme, it can lead to more severe issues like isolation, depression, anxiety, or suicidal thoughts. Talk with your health care provider if you have any of these issues. This information is not intended to replace advice given to you by your health care provider. Make sure you discuss any questions you have with your health care provider. Document Revised: 12/18/2020 Document Reviewed: 12/18/2020 Elsevier Patient Education  2023 Elsevier Inc.  

## 2021-09-28 NOTE — Progress Notes (Signed)
   Subjective:    Patient ID: Lisa Cervantes, female    DOB: 2001-04-30, 21 y.o.   MRN: [id number]  HPI  Patient has having sharp, stabbing pain in chest since her father passed away.  Began right after his death in 04-Jul-2022.  Random.  Unassociated with any activity.  Describes as a stabbing, sharp pain in the mid to lower sternum.  Localized.  Can last up to 4 to 5 minutes.  Worse with a deep breath.  No true shortness of breath but what she describes as a "stuttering breath".  Stretching helps with the discomfort.  Occurs every other day on average.  Will occasionally bother her at nighttime, not every night.  States she will have a sensation of falling out of the bed which will wake her up with pain.  Not sleeping well.  Has tried melatonin and other naturals with minimal improvement.  Had nightmares when taking hydroxyzine.  Stopped her sertraline due to feeling agitated.  Does fine at home but notices more anxiety and stress with any activity outside the home including work.  Slightly lightheaded and dizzy at times, denies any syncopal episodes. Denies suicidal or homicidal thoughts or ideation.       Objective:   Physical Exam NAD.  Alert, oriented.  Fatigued in appearance.  Slightly tearful at times.  Speech clear.  Dressed appropriately for the weather.  Thoughts logical coherent and relevant.  Lungs clear.  Heart regular rate rhythm.  No murmur or gallop noted.  Mild tenderness with palpation of the lower mid anterior chest wall. Today's Vitals   09/28/21 0916  BP: 110/71  Pulse: 80  Temp: 97.8 F (36.6 C)  TempSrc: Temporal  SpO2: 100%  Weight: 139 lb 3.2 oz (63.1 kg)  Height: 5\' 3"  (1.6 m)   Body mass index is 24.66 kg/m.        Assessment & Plan:   Problem List Items Addressed This Visit       Other   Anxiety   Relevant Medications   desvenlafaxine (PRISTIQ) 25 MG 24 hr tablet   Grief - Primary   Psychophysiological insomnia   Meds ordered this encounter   Medications   desvenlafaxine (PRISTIQ) 25 MG 24 hr tablet    Sig: Take 1 tablet (25 mg total) by mouth daily.    Dispense:  30 tablet    Refill:  0    Order Specific Question:   Supervising Provider    Answer:   A [[id number]]   Trial of Pristiq as directed.  Cautioned about potential adverse effects.  Discontinue medication and contact office if any problems. Defers grief counseling at this time but will let Lilyan Punt know if she changes her mind. Return in about 1 month (around 10/29/2021) for phone or virtual .

## 2021-11-02 ENCOUNTER — Ambulatory Visit: Payer: Self-pay | Admitting: Nurse Practitioner

## 2021-11-07 DIAGNOSIS — S0990XA Unspecified injury of head, initial encounter: Secondary | ICD-10-CM | POA: Diagnosis not present

## 2021-11-07 DIAGNOSIS — F17210 Nicotine dependence, cigarettes, uncomplicated: Secondary | ICD-10-CM | POA: Diagnosis not present

## 2021-11-07 DIAGNOSIS — S0003XA Contusion of scalp, initial encounter: Secondary | ICD-10-CM | POA: Diagnosis not present

## 2021-11-07 DIAGNOSIS — R519 Headache, unspecified: Secondary | ICD-10-CM | POA: Diagnosis not present

## 2021-11-07 DIAGNOSIS — G44319 Acute post-traumatic headache, not intractable: Secondary | ICD-10-CM | POA: Diagnosis not present

## 2021-11-16 ENCOUNTER — Other Ambulatory Visit: Payer: Self-pay | Admitting: Nurse Practitioner

## 2021-11-16 ENCOUNTER — Encounter: Payer: Self-pay | Admitting: Nurse Practitioner

## 2021-11-16 MED ORDER — NICOTINE 14 MG/24HR TD PT24: 14.0000 mg | MEDICATED_PATCH | Freq: Every day | TRANSDERMAL | 0 refills | Status: DC

## 2021-11-23 ENCOUNTER — Ambulatory Visit (INDEPENDENT_AMBULATORY_CARE_PROVIDER_SITE_OTHER): Payer: Medicaid Other | Admitting: Nurse Practitioner

## 2021-11-23 VITALS — BP 103/70 | HR 94 | Temp 98.4°F | Ht 63.0 in | Wt 135.0 lb

## 2021-11-23 DIAGNOSIS — F5104 Psychophysiologic insomnia: Secondary | ICD-10-CM | POA: Diagnosis not present

## 2021-11-23 DIAGNOSIS — N926 Irregular menstruation, unspecified: Secondary | ICD-10-CM | POA: Diagnosis not present

## 2021-11-23 DIAGNOSIS — F419 Anxiety disorder, unspecified: Secondary | ICD-10-CM

## 2021-11-23 DIAGNOSIS — G43009 Migraine without aura, not intractable, without status migrainosus: Secondary | ICD-10-CM | POA: Diagnosis not present

## 2021-11-23 NOTE — Progress Notes (Unsigned)
Subjective:    Patient ID: Lisa Cervantes, female    DOB: November 02, 2000, 21 y.o.   MRN: [id number]  HPI Follow up for anxiety taking desvenlafaxine  Has been having Migraines almost daily lasting a few hrs each  Having trouble sleeping through the night  Has had more anxiety lately.  Some improvement with Pristiq 25 mg.  Denies any suicidal or homicidal thoughts or ideation.  Denies any self-harm behaviors.  Would like referral to psychiatry for medication management. Cycles remains slightly irregular with light flow lasting 4 to 5 days.  Her headaches are unassociated with her menses.  Has a history of chronic headaches.  Now having them 3-4 times a week usually occurring in the mid afternoon.  Sudden onset, denies any signs of an aura.  Starts with "a knot" in her neck at the back of the head.  Describes as a squeezing and pressure that starts at the back of the head to the frontal area 8 out of 10 on the pain scale.  Nausea but no vomiting.  No visual changes.  No numbness or weakness of the face arms or legs.  Sensitive to light but no problems with sound.  Has been under a lot more stress with school and work.  Triggers seem to be heat which she is exposed to at work.  Unassociated with foods.  Minimal caffeine in her diet.  Some relief with Excedrin Migraine or BC powders.  Better with rest.  Has also seen improvement with use of marijuana.  Patient continues to actively try to conceive, no current contraceptives. Seen at [facility] health ED on 11/07/2021 due to headaches after falling and hitting her head in the shower about 10 days before.  She was diagnosed with acute non-intractable headache.  According to the notes her lab work and CT were normal.       Objective:   Physical Exam NAD.  Alert, oriented.  Making good eye contact.  Mildly anxious affect, tearful at times.  Dressed appropriately for the weather.  Speech clear.  Thoughts logical coherent and relevant.  Tight tender muscles noted  along the cervical area into the trapezius and upper rhomboids.  A firm area consistent with a muscle spasm noted in the upper right cervical area near the occiput.  Can perform full ROM of the neck.  Hand strength 5+ bilaterally.  Reflexes normal limit.  Romberg negative.  Gait normal. Today's Vitals   11/23/21 1442  BP: 103/70  Pulse: 94  Temp: 98.4 F (36.9 C)  SpO2: 98%  Weight: 135 lb (61.2 kg)  Height: 5\' 3"  (1.6 m)   Body mass index is 23.91 kg/m.        Assessment & Plan:   Problem List Items Addressed This Visit       Cardiovascular and Mediastinum   Migraine headache without aura - Primary   Relevant Medications   desvenlafaxine (PRISTIQ) 50 MG 24 hr tablet     Other   Anxiety   Relevant Medications   desvenlafaxine (PRISTIQ) 50 MG 24 hr tablet   Other Relevant Orders   Ambulatory referral to Psychiatry   Irregular periods/menstrual cycles   Psychophysiological insomnia   Relevant Orders   Ambulatory referral to Psychiatry   Discussed use of hormones to regulate her cycle.  Patient is considering this and will get back with our office if she decides to start pills. Increase Pristiq to 50 mg daily.  Reviewed potential adverse effects.  Discontinue medication and contact  office if any significant problems. Refer to psychiatry for medication management.  Continue counseling. Hold on migraine medications at this point.  Patient to track her migraines and see if she can identify any other triggers. Recommend massage therapy, ice/heat applications, stretching exercises and consider chiropractic treatment for neck spasms.  This appears to be one of the main contributors to her migraines. Warning signs reviewed regarding her migraines, call or go to ED if worsening symptoms. Return in about 1 month (around 12/24/2021).

## 2021-11-23 NOTE — Patient Instructions (Signed)
Massage therapy Stretching exercises Chiropractor

## 2021-11-25 ENCOUNTER — Encounter: Payer: Self-pay | Admitting: Nurse Practitioner

## 2021-11-25 MED ORDER — DESVENLAFAXINE SUCCINATE ER 50 MG PO TB24: 50.0000 mg | ORAL_TABLET | Freq: Every day | ORAL | 0 refills | Status: DC

## 2021-12-03 ENCOUNTER — Other Ambulatory Visit: Payer: Self-pay | Admitting: Nurse Practitioner

## 2021-12-06 ENCOUNTER — Encounter: Payer: Self-pay | Admitting: Nurse Practitioner

## 2021-12-11 ENCOUNTER — Other Ambulatory Visit: Payer: Self-pay | Admitting: Nurse Practitioner

## 2021-12-11 DIAGNOSIS — M9901 Segmental and somatic dysfunction of cervical region: Secondary | ICD-10-CM | POA: Diagnosis not present

## 2021-12-11 DIAGNOSIS — M9904 Segmental and somatic dysfunction of sacral region: Secondary | ICD-10-CM | POA: Diagnosis not present

## 2021-12-11 DIAGNOSIS — M9902 Segmental and somatic dysfunction of thoracic region: Secondary | ICD-10-CM | POA: Diagnosis not present

## 2021-12-11 DIAGNOSIS — M9903 Segmental and somatic dysfunction of lumbar region: Secondary | ICD-10-CM | POA: Diagnosis not present

## 2021-12-11 DIAGNOSIS — R519 Headache, unspecified: Secondary | ICD-10-CM | POA: Diagnosis not present

## 2021-12-11 DIAGNOSIS — M9905 Segmental and somatic dysfunction of pelvic region: Secondary | ICD-10-CM | POA: Diagnosis not present

## 2021-12-28 ENCOUNTER — Ambulatory Visit: Payer: Medicaid Other | Admitting: Nurse Practitioner

## 2022-01-04 ENCOUNTER — Ambulatory Visit (INDEPENDENT_AMBULATORY_CARE_PROVIDER_SITE_OTHER): Payer: Medicaid Other | Admitting: Nurse Practitioner

## 2022-01-04 VITALS — BP 108/73 | HR 106 | Temp 98.3°F | Ht 63.0 in | Wt 135.0 lb

## 2022-01-04 DIAGNOSIS — F419 Anxiety disorder, unspecified: Secondary | ICD-10-CM

## 2022-01-04 DIAGNOSIS — Y92009 Unspecified place in unspecified non-institutional (private) residence as the place of occurrence of the external cause: Secondary | ICD-10-CM | POA: Diagnosis not present
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
## 2022-01-05 ENCOUNTER — Encounter: Payer: Self-pay | Admitting: Nurse Practitioner

## 2022-01-05 MED ORDER — FLUOXETINE HCL 10 MG PO CAPS: 10.0000 mg | ORAL_CAPSULE | Freq: Every day | ORAL | 0 refills | Status: DC

## 2022-02-04 ENCOUNTER — Encounter: Payer: Self-pay | Admitting: Nurse Practitioner

## 2022-02-09 ENCOUNTER — Other Ambulatory Visit: Payer: Self-pay | Admitting: Nurse Practitioner

## 2022-02-09 MED ORDER — PROMETHAZINE-DM 6.25-15 MG/5ML PO SYRP: 5.0000 mL | ORAL_SOLUTION | Freq: Four times a day (QID) | ORAL | 0 refills | Status: DC | PRN

## 2022-02-25 ENCOUNTER — Encounter (HOSPITAL_COMMUNITY): Payer: Self-pay

## 2022-02-25 ENCOUNTER — Telehealth (HOSPITAL_COMMUNITY): Payer: Medicaid Other | Admitting: Psychiatry

## 2022-03-11 ENCOUNTER — Other Ambulatory Visit (HOSPITAL_COMMUNITY)
Admission: RE | Admit: 2022-03-11 | Discharge: 2022-03-11 | Disposition: A | Discharge status: Home / Self Care | Payer: Medicaid Other | Source: Ambulatory Visit | Attending: Adult Health | Admitting: Adult Health

## 2022-03-11 ENCOUNTER — Ambulatory Visit: Payer: Medicaid Other | Admitting: Adult Health

## 2022-03-11 ENCOUNTER — Encounter: Payer: Self-pay | Admitting: Adult Health

## 2022-03-11 VITALS — BP 116/85 | HR 110 | Ht 63.0 in | Wt 134.5 lb

## 2022-03-11 DIAGNOSIS — Z319 Encounter for procreative management, unspecified: Secondary | ICD-10-CM | POA: Diagnosis not present

## 2022-03-11 DIAGNOSIS — N941 Unspecified dyspareunia: Secondary | ICD-10-CM | POA: Diagnosis not present

## 2022-03-11 DIAGNOSIS — Z124 Encounter for screening for malignant neoplasm of cervix: Secondary | ICD-10-CM | POA: Insufficient documentation

## 2022-03-11 DIAGNOSIS — N926 Irregular menstruation, unspecified: Secondary | ICD-10-CM | POA: Diagnosis not present

## 2022-03-11 DIAGNOSIS — Z3202 Encounter for pregnancy test, result negative: Secondary | ICD-10-CM | POA: Diagnosis not present

## 2022-03-11 LAB — POCT URINE PREGNANCY: Preg Test, Ur: NEGATIVE

## 2022-03-11 MED ORDER — CITRANATAL HARMONY 27-1-260 MG PO CAPS: 1.0000 | ORAL_CAPSULE | Freq: Every day | ORAL | 12 refills | Status: DC

## 2022-03-11 NOTE — Progress Notes (Signed)
  Subjective:     Patient ID: Lisa Cervantes, female   DOB: May 02, 2001, 21 y.o.   MRN: [id number]  HPI [name] is a 21 year old white female, with SO, G0P0, in complaining of pain with sex at times. And she wants to get pregnant, periods are not regular. She has tried clomid about 2 years ago, she thinks she ovulated once. She needs pap today too.  PCP is Dr Lacinda Axon  Review of Systems Pain with sex at times Periods ar not regular, may skip month.  Reviewed past medical,surgical, social and family history. Reviewed medications and allergies.     Objective:   Physical Exam BP 116/85 (BP Location: Left Arm, Patient Position: Sitting, Cuff Size: Normal)   Pulse (!) 110   Ht 5\' 3"  (1.6 m)   Wt 134 lb 8 oz (61 kg)   LMP 01/26/2022   BMI 23.83 kg/m     UPT is negative Skin warm and dry.Pelvic: external genitalia is normal in appearance no lesions, vagina:pink,urethra has no lesions or masses noted, cervix:smooth,pap with GC/CHL performed, uterus: normal size, shape and contour, non tender, no masses felt, adnexa: no masses or tenderness noted. Bladder is non tender and no masses felt. When I moved her cervix round with my finger, it was like when as sex.  Fall risk is moderate  Upstream - 03/11/22 1154       Pregnancy Intention Screening   Does the patient want to become pregnant in the next year? Yes    Does the patient's partner want to become pregnant in the next year? Yes    Would the patient like to discuss contraceptive options today? No      Contraception Wrap Up   Current Method Pregnant/Seeking Pregnancy    End Method Pregnant/Seeking Pregnancy             Assessment:     1. Routine Papanicolaou smear Pap sent Pap in 3 years if normal   2. Pregnancy examination or test, negative result  3. Dyspareunia, female Try different positions,where he does not get as good penetration  Review handout on dyspareunia  4. Patient desires pregnancy Take PNV Discussed  timing of sex Meds ordered this encounter  Medications   Prenat-FeFmCb-[id number]-FA-DHA w/o A (CITRANATAL HARMONY) 27-1-260 MG CAPS    Sig: Take 1 tablet by mouth daily.    Dispense:  30 capsule    Refill:  12    Order Specific Question:   Supervising Provider    Answer:   Elonda Husky, LUTHER H [[id number]]     5. Irregular periods/menstrual cycles Call when next period starts, will check progesterone level day 21    If not ovulating will try femara this time   Plan:     Follow up prn

## 2022-03-12 ENCOUNTER — Other Ambulatory Visit: Payer: Self-pay | Admitting: Adult Health

## 2022-03-12 LAB — CYTOLOGY - PAP
Adequacy: ABSENT
Chlamydia: NEGATIVE
Comment: NEGATIVE
Comment: NORMAL
Diagnosis: NEGATIVE
Neisseria Gonorrhea: NEGATIVE

## 2022-03-12 MED ORDER — METRONIDAZOLE 500 MG PO TABS: 500.0000 mg | ORAL_TABLET | Freq: Two times a day (BID) | ORAL | 0 refills | Status: DC

## 2022-03-12 NOTE — Progress Notes (Signed)
+  BV on pap will rx flagyl, no sex or alcohol while taking meds

## 2022-03-14 ENCOUNTER — Other Ambulatory Visit: Payer: Self-pay | Admitting: Adult Health

## 2022-03-14 DIAGNOSIS — Z319 Encounter for procreative management, unspecified: Secondary | ICD-10-CM

## 2022-03-14 NOTE — Progress Notes (Signed)
Ck progesterone level 04/03/22

## 2022-04-03 DIAGNOSIS — Z319 Encounter for procreative management, unspecified: Secondary | ICD-10-CM | POA: Diagnosis not present

## 2022-04-04 LAB — PROGESTERONE: Progesterone: 0.3 ng/mL

## 2022-04-08 ENCOUNTER — Other Ambulatory Visit: Payer: Self-pay | Admitting: Adult Health

## 2022-04-08 MED ORDER — LETROZOLE 2.5 MG PO TABS: ORAL_TABLET | ORAL | 2 refills | Status: DC

## 2022-04-08 NOTE — Progress Notes (Signed)
Will rx femara  

## 2022-04-10 ENCOUNTER — Ambulatory Visit: Payer: Medicaid Other | Admitting: Obstetrics & Gynecology

## 2022-07-10 ENCOUNTER — Telehealth: Payer: Medicaid Other | Admitting: Family Medicine

## 2022-07-10 DIAGNOSIS — J069 Acute upper respiratory infection, unspecified: Secondary | ICD-10-CM

## 2022-07-10 MED ORDER — BENZONATATE 100 MG PO CAPS: 100.0000 mg | ORAL_CAPSULE | Freq: Three times a day (TID) | ORAL | 0 refills | Status: DC | PRN

## 2022-07-10 MED ORDER — PSEUDOEPH-BROMPHEN-DM 30-2-10 MG/5ML PO SYRP: 5.0000 mL | ORAL_SOLUTION | Freq: Four times a day (QID) | ORAL | 0 refills | Status: DC | PRN
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
## 2022-07-10 NOTE — Patient Instructions (Addendum)
Lisa Cervantes, thank you for joining Lisa Mayo, NP for today's virtual visit.  While this provider is not your primary care provider (PCP), if your PCP is located in our provider database this encounter information will be shared with them immediately following your visit.   [facility] account gives you access to today's visit and all your visits, tests, and labs performed at [facility] " click here if you don't have a [facility] account or go to [email or web address]  Consent: (Patient) Lisa Cervantes provided verbal consent for this virtual visit at the beginning of the encounter.  Current Medications:  Current Outpatient Medications:    benzonatate (TESSALON) 100 MG capsule, Take 1 capsule (100 mg total) by mouth 3 (three) times daily as needed for cough., Disp: 30 capsule, Rfl: 0   brompheniramine-pseudoephedrine-DM 30-2-10 MG/5ML syrup, Take 5 mLs by mouth 4 (four) times daily as needed., Disp: 120 mL, Rfl: 0   letrozole (FEMARA) 2.5 MG tablet, Take 1 days 3-7 of cycle, Disp: 5 tablet, Rfl: 2   FLUoxetine (PROZAC) 10 MG capsule, Take 1 capsule (10 mg total) by mouth daily., Disp: 30 capsule, Rfl: 0   metroNIDAZOLE (FLAGYL) 500 MG tablet, Take 1 tablet (500 mg total) by mouth 2 (two) times daily., Disp: 14 tablet, Rfl: 0   Prenat-FeFmCb-DSS-FA-DHA w/o A (CITRANATAL HARMONY) 27-1-260 MG CAPS, Take 1 tablet by mouth daily., Disp: 30 capsule, Rfl: 12   Medications ordered in this encounter:  Meds ordered this encounter  Medications   brompheniramine-pseudoephedrine-DM 30-2-10 MG/5ML syrup    Sig: Take 5 mLs by mouth 4 (four) times daily as needed.    Dispense:  120 mL    Refill:  0    Order Specific Question:   Supervising Provider    Answer:   Chase Picket [id number]   benzonatate (TESSALON) 100 MG capsule    Sig: Take 1 capsule (100 mg total) by mouth 3 (three) times daily as needed for cough.    Dispense:  30 capsule    Refill:   0    Order Specific Question:   Supervising Provider    Answer:   Chase Picket [id number]     *If you need refills on other medications prior to your next appointment, please contact your pharmacy*  Follow-Up: Call back or seek an in-person evaluation if the symptoms worsen or if the condition fails to improve as anticipated.  [facility] [phone]  Other Instructions  -Take meds as prescribed -Rest -Use a cool mist humidifier especially during the winter months when heat dries out the air. - Use saline nose sprays frequently to help soothe nasal passages and promote drainage. -Saline irrigations of the nose can be very helpful if done frequently.             * 4X daily for 1 week*             * Use of a nettie pot can be helpful with this.  *Follow directions with this* *Boiled or distilled water only -stay hydrated by drinking plenty of fluids - Keep thermostat turn down low to prevent drying out sinuses - For fever or aches or pains- take tylenol or ibuprofen as directed on bottle             * for fevers greater than 101 orally you may alternate ibuprofen and tylenol every 3 hours.  If you do not improve you will need a follow  up visit in person.                  If you have been instructed to have an in-person evaluation today at a local Urgent Care facility, please use the link below. It will take you to a list of all of our available [facility], including address, phone number and hours of operation. Please do not delay care.  [facility]  If you or a family member do not have a primary care provider, use the link below to schedule a visit and establish care. When you choose a [facility] primary care physician or advanced practice provider, you gain a long-term partner in health. Find a Primary Care Provider  Learn more about [facility]'s in-office and virtual care options: [facility]

## 2022-07-14 ENCOUNTER — Encounter: Payer: Self-pay | Admitting: Nurse Practitioner

## 2022-07-15 ENCOUNTER — Other Ambulatory Visit: Payer: Self-pay | Admitting: Family Medicine

## 2022-08-22 ENCOUNTER — Telehealth: Payer: Medicaid Other | Admitting: Physician Assistant

## 2022-08-22 ENCOUNTER — Encounter: Payer: Self-pay | Admitting: Physician Assistant

## 2022-08-22 DIAGNOSIS — R1084 Generalized abdominal pain: Secondary | ICD-10-CM

## 2022-08-22 MED ORDER — ONDANSETRON 4 MG PO TBDP: 4.0000 mg | ORAL_TABLET | Freq: Three times a day (TID) | ORAL | 0 refills | Status: DC | PRN

## 2022-08-22 NOTE — Patient Instructions (Addendum)
Lisa Cervantes, thank you for joining [name], PA-C for today's virtual visit.  While this provider is not your primary care provider (PCP), if your PCP is located in our provider database this encounter information will be shared with them immediately following your visit.   A [facility] MyChart account gives you access to today's visit and all your visits, tests, and labs performed at [facility] " click here if you don't have a [facility] MyChart account or go to [email or web address]  Consent: (Patient) Lisa Cervantes provided verbal consent for this virtual visit at the beginning of the encounter.  Current Medications:  Current Outpatient Medications:    letrozole (FEMARA) 2.5 MG tablet, Take 1 days 3-7 of cycle, Disp: 5 tablet, Rfl: 2   Prenat-FeFmCb-DSS-FA-DHA w/o A (CITRANATAL HARMONY) 27-1-260 MG CAPS, Take 1 tablet by mouth daily., Disp: 30 capsule, Rfl: 12   Medications ordered in this encounter:  No orders of the defined types were placed in this encounter.    *If you need refills on other medications prior to your next appointment, please contact your pharmacy*  Follow-Up: Call back or seek an in-person evaluation if the symptoms worsen or if the condition fails to improve as anticipated.  [facility] [phone]  Other Instructions For now, avoid gluten and follow bland diet (see below) for tonight and tomorrow. Hydration is key.  Ok to continue OTC Pepto Bismol.  Avoid NSAIDs (Ibuprofen, Naproxen) and Aspirin. Can use Tylenol if needed. Use the Zofran as directed for nausea and prevent vomiting. Contact PCP to schedule in person follow-up for further workup and possible GI referral. IF anything worsens before evaluation, you need to e seen at nearest ER.   Bland Diet A bland diet may consist of soft foods or foods that are not high in fat or are not greasy, acidic, or spicy. Avoiding certain foods may cause less  irritation to your mouth, throat, stomach, or gastrointestinal tract. Avoiding certain foods may make you feel better. Everyone's tolerances are different. A bland diet should be based on what you can tolerate and what may cause discomfort. What is my plan? Your health care provider or dietitian may recommend specific changes to your diet to treat your symptoms. These changes may include: Eating small meals frequently. Cooking food until it is soft enough to chew easily. Taking the time to chew your food thoroughly, so it is easy to swallow and digest. Avoiding foods that cause you discomfort. These may include spicy food, fried food, greasy foods, hard-to-chew foods, or citrus fruits and juices. Drinking slowly. What are tips for following this plan? Reading food labels To reduce fiber intake, look for food labels that say "whole," such as whole wheat or whole grain. Shopping Avoid food items that may have nuts or seeds. Avoid vegetables that may make you gassy or have a tough texture, such as broccoli, cauliflower, or corn. Cooking Cook foods thoroughly so they have a soft texture. Meal planning Make sure you include foods from all food groups to eat a balanced diet. Eat a variety of types of foods. Eat foods and drink beverages that do not cause you discomfort. These may include soups and broths with cooked meats, pasta, and vegetables. Lifestyle Sit up after meals, avoid tight clothing, and take time to eat and chew your food slowly. Ask your health care provider whether you should take dietary supplements. General information Mildly season your foods. Some seasonings, such as cayenne pepper, vinegar,  or hot sauce, may cause irritation. The foods, beverages, or seasonings to avoid should be based on individual tolerance. What foods should I eat? Fruits Canned or cooked fruit such as peaches, pears, or applesauce. Bananas. Vegetables Well-cooked vegetables. Canned or cooked  vegetables such as carrots, green beans, beets, or spinach. Mashed or boiled potatoes. Grains  Hot cereals, such as cream of wheat and processed oatmeal. Rice. Bread, crackers, pasta, or tortillas made from refined white flour. Meats and other proteins  Eggs. Creamy peanut butter or other nut butters. Lean, well-cooked tender meats, such as beef, pork, chicken, or fish. Dairy Low-fat dairy products such as milk, cottage cheese, or yogurt. Beverages  Water. Herbal tea. Apple juice. Fats and oils Mild salad dressings. Canola or olive oil. Sweets and desserts Low-fat pudding, custard, or ice cream. Fruit gelatin. The items listed above may not be a complete list of foods and beverages you can eat. Contact a dietitian for more information. What foods should I avoid? Fruits Citrus fruits, such as oranges and grapefruit. Fruits with a stringy texture. Fruits that have lots of seeds, such as kiwi or strawberries. Dried fruits. Vegetables Raw, uncooked vegetables. Salads. Grains Whole grain breads, muffins, and cereals. Meats and other proteins Tough, fibrous meats. Highly seasoned meat such as corned beef, smoked meats, or fish. Processed high-fat meats such as brats, hot dogs, or sausage. Dairy Full-fat dairy foods such as ice cream and cheese. Beverages Caffeinated drinks. Alcohol. Seasonings and condiments Strongly flavored seasonings or condiments. Hot sauce. Salsa. Other foods Spicy foods. Fried or greasy foods. Sour foods, such as pickled or fermented foods like sauerkraut. Foods high in fiber. The items listed above may not be a complete list of foods and beverages you should avoid. Contact a dietitian for more information. Summary A bland diet should be based on individual tolerance. It may consist of foods that are soft textured and do not have a lot of fat, fiber, acid, or seasonings. A bland diet may be recommended because avoiding certain foods, beverages, or spices may make  you feel better. This information is not intended to replace advice given to you by your health care provider. Make sure you discuss any questions you have with your health care provider. Document Revised: 03/19/2021 Document Reviewed: 03/19/2021 Elsevier Patient Education  2023 Elsevier Inc.    If you have been instructed to have an in-person evaluation today at a local Urgent Care facility, please use the link below. It will take you to a list of all of our available [facility], including address, phone number and hours of operation. Please do not delay care.  [facility]  If you or a family member do not have a primary care provider, use the link below to schedule a visit and establish care. When you choose a [facility] primary care physician or advanced practice provider, you gain a long-term partner in health. Find a Primary Care Provider  Learn more about [facility]'s in-office and virtual care options:  - Get Care Now

## 2022-08-22 NOTE — Progress Notes (Signed)
The patient no-showed for appointment despite this provider sending direct link with no response and waiting for at least 10 minutes from appointment time for patient to join. They will be marked as a NS for this appointment/time.   Massey Ruhland Cody Maricia Scotti, PA-C    
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
## 2022-09-30 ENCOUNTER — Other Ambulatory Visit: Payer: Self-pay | Admitting: Adult Health

## 2022-09-30 DIAGNOSIS — Z319 Encounter for procreative management, unspecified: Secondary | ICD-10-CM

## 2022-09-30 NOTE — Progress Notes (Signed)
Ck progesterone level June 7

## 2022-10-09 DIAGNOSIS — N10 Acute tubulo-interstitial nephritis: Secondary | ICD-10-CM | POA: Diagnosis not present

## 2022-10-09 DIAGNOSIS — R109 Unspecified abdominal pain: Secondary | ICD-10-CM | POA: Diagnosis not present

## 2022-10-17 DIAGNOSIS — Z319 Encounter for procreative management, unspecified: Secondary | ICD-10-CM | POA: Diagnosis not present

## 2022-10-18 LAB — PROGESTERONE: Progesterone: 0.3 ng/mL

## 2022-10-22 ENCOUNTER — Other Ambulatory Visit: Payer: Self-pay | Admitting: Adult Health

## 2022-10-22 MED ORDER — LETROZOLE 2.5 MG PO TABS: ORAL_TABLET | ORAL | 2 refills | Status: DC

## 2022-10-22 NOTE — Progress Notes (Signed)
Rx femara 5 mg

## 2022-11-19 ENCOUNTER — Other Ambulatory Visit: Payer: Self-pay | Admitting: Adult Health

## 2022-11-19 DIAGNOSIS — Z319 Encounter for procreative management, unspecified: Secondary | ICD-10-CM

## 2022-11-19 NOTE — Progress Notes (Signed)
Ck progesterone 7/29, order is in.

## 2022-11-20 ENCOUNTER — Telehealth: Payer: Self-pay | Admitting: *Deleted

## 2022-11-20 ENCOUNTER — Telehealth: Payer: Self-pay | Admitting: Adult Health

## 2022-11-20 NOTE — Telephone Encounter (Signed)
close

## 2022-11-20 NOTE — Telephone Encounter (Signed)
Pharmacy only gave pt 5 tabs of Femara. [facility]. Pt needs a refill on Prenatal. I spoke with JAG. [facility] [place] [facility] and if pt was shorted 5 tabs, they would give to pt. Pt aware and voiced understanding. JSY

## 2022-12-09 DIAGNOSIS — Z319 Encounter for procreative management, unspecified: Secondary | ICD-10-CM | POA: Diagnosis not present

## 2022-12-23 ENCOUNTER — Telehealth: Payer: Self-pay | Admitting: *Deleted

## 2022-12-23 ENCOUNTER — Other Ambulatory Visit: Payer: Self-pay | Admitting: Adult Health

## 2022-12-23 NOTE — Telephone Encounter (Signed)
I gave verbal order to [facility] pharmacy for Letrozole and Citranatal [place]. JSY

## 2022-12-24 ENCOUNTER — Other Ambulatory Visit: Payer: Self-pay | Admitting: Adult Health

## 2022-12-24 DIAGNOSIS — Z319 Encounter for procreative management, unspecified: Secondary | ICD-10-CM

## 2022-12-24 NOTE — Progress Notes (Signed)
Ck progesterone level 01/13/23

## 2023-01-03 DIAGNOSIS — Z87442 Personal history of urinary calculi: Secondary | ICD-10-CM | POA: Diagnosis not present

## 2023-01-03 DIAGNOSIS — R109 Unspecified abdominal pain: Secondary | ICD-10-CM | POA: Diagnosis not present

## 2023-01-03 DIAGNOSIS — R102 Pelvic and perineal pain: Secondary | ICD-10-CM | POA: Diagnosis not present

## 2023-01-03 DIAGNOSIS — R1032 Left lower quadrant pain: Secondary | ICD-10-CM | POA: Diagnosis not present

## 2023-01-03 DIAGNOSIS — Z3202 Encounter for pregnancy test, result negative: Secondary | ICD-10-CM | POA: Diagnosis not present

## 2023-01-14 DIAGNOSIS — Z319 Encounter for procreative management, unspecified: Secondary | ICD-10-CM | POA: Diagnosis not present

## 2023-01-15 ENCOUNTER — Telehealth: Payer: Medicaid Other | Admitting: Nurse Practitioner

## 2023-01-15 ENCOUNTER — Encounter: Payer: Self-pay | Admitting: Nurse Practitioner

## 2023-01-15 DIAGNOSIS — N970 Female infertility associated with anovulation: Secondary | ICD-10-CM

## 2023-01-15 DIAGNOSIS — N926 Irregular menstruation, unspecified: Secondary | ICD-10-CM

## 2023-01-15 NOTE — Progress Notes (Unsigned)
   Subjective:    Patient ID: Lisa Cervantes, female    DOB: May 29, 2000, 22 y.o.   MRN: [id number] Virtual Visit via Video Note  I connected with Lisa Cervantes on 01/16/23 at  4:00 PM EDT by a video enabled telemedicine application and verified that I am speaking with the correct person using two identifiers.  Location: Patient: home Provider: office   I discussed the limitations of evaluation and management by telemedicine and the availability of in person appointments. The patient expressed understanding and agreed to proceed.  History of Present Illness: Irregular cycles for 4 years  Referral to a specialist for reproductive health      Objective:   Physical Exam        Assessment & Plan:

## 2023-01-16 ENCOUNTER — Encounter: Payer: Self-pay | Admitting: Nurse Practitioner

## 2023-01-16 NOTE — Progress Notes (Addendum)
   Subjective:    Patient ID: Lisa Cervantes, female    DOB: 23-May-2000, 22 y.o.   MRN: [id number] Virtual Visit via Video Note  I connected with Lisa Cervantes on 01/16/23 at  4:00 PM EDT by a video enabled telemedicine application and verified that I am speaking with the correct person using two identifiers.  Location: Patient: home Provider: office   I discussed the limitations of evaluation and management by telemedicine and the availability of in person appointments. The patient expressed understanding and agreed to proceed.  History of Present Illness: Irregular cycles for 4 years  Referral to a specialist for reproductive health Has been seen by gynecology.  Currently on letrozole to help induce ovulation.  States she has had an ovulation long-term.  Irregular cycles.  Not as concerned about fertility as much as why she is not ovulating. Requesting referral to a reproductive specialist.  Observations/Objective: NAD.  Alert, oriented.  Calm cheerful affect.  Assessment and Plan: Problem List Items Addressed This Visit       Other   Anovulation   Relevant Orders   Ambulatory referral to Gynecology   Irregular periods/menstrual cycles - Primary   Relevant Orders   Ambulatory referral to Gynecology     Follow Up Instructions: Referral was sent to specialist and gynecology.  Patient to call back if she does not receive word about the appointment.   I discussed the assessment and treatment plan with the patient. The patient was provided an opportunity to ask questions and all were answered. The patient agreed with the plan and demonstrated an understanding of the instructions.   The patient was advised to call back or seek an in-person evaluation if the symptoms worsen or if the condition fails to improve as anticipated.  I provided 15 minutes of non-face-to-face time during this encounter.   Campbell Riches, NP  HPI  Irregular cycles for 4 years  Referral  to a specialist for reproductive health  Review of Systems     Objective:   Physical Exam        Assessment & Plan:

## 2023-02-05 ENCOUNTER — Other Ambulatory Visit: Payer: Medicaid Other

## 2023-02-05 DIAGNOSIS — Z319 Encounter for procreative management, unspecified: Secondary | ICD-10-CM

## 2023-02-05 NOTE — Progress Notes (Signed)
Cycle started 9/5 lasted 5 days

## 2023-02-06 LAB — PROGESTERONE: Progesterone: 13.1 ng/mL

## 2023-02-18 ENCOUNTER — Other Ambulatory Visit: Payer: Self-pay | Admitting: Adult Health

## 2023-02-18 DIAGNOSIS — Z319 Encounter for procreative management, unspecified: Secondary | ICD-10-CM

## 2023-02-18 NOTE — Progress Notes (Signed)
Ck progesterone level 10/25

## 2023-02-19 ENCOUNTER — Other Ambulatory Visit: Payer: Self-pay | Admitting: Adult Health

## 2023-03-07 DIAGNOSIS — Z319 Encounter for procreative management, unspecified: Secondary | ICD-10-CM | POA: Diagnosis not present

## 2023-03-08 LAB — PROGESTERONE: Progesterone: 0.9 ng/mL

## 2023-03-17 ENCOUNTER — Other Ambulatory Visit: Payer: Self-pay

## 2023-03-17 ENCOUNTER — Encounter (HOSPITAL_BASED_OUTPATIENT_CLINIC_OR_DEPARTMENT_OTHER): Payer: Self-pay

## 2023-03-17 ENCOUNTER — Emergency Department (HOSPITAL_BASED_OUTPATIENT_CLINIC_OR_DEPARTMENT_OTHER)
Admission: EM | Admit: 2023-03-17 | Discharge: 2023-03-17 | Disposition: A | Discharge status: Home / Self Care | Payer: Medicaid Other | Attending: Emergency Medicine | Admitting: Emergency Medicine

## 2023-03-17 DIAGNOSIS — R197 Diarrhea, unspecified: Secondary | ICD-10-CM | POA: Insufficient documentation

## 2023-03-17 DIAGNOSIS — R1110 Vomiting, unspecified: Secondary | ICD-10-CM | POA: Diagnosis not present

## 2023-03-17 DIAGNOSIS — R0902 Hypoxemia: Secondary | ICD-10-CM | POA: Diagnosis not present

## 2023-03-17 DIAGNOSIS — R1111 Vomiting without nausea: Secondary | ICD-10-CM | POA: Diagnosis not present

## 2023-03-17 DIAGNOSIS — R1084 Generalized abdominal pain: Secondary | ICD-10-CM | POA: Diagnosis not present

## 2023-03-17 LAB — COMPREHENSIVE METABOLIC PANEL
ALT: 15 U/L (ref 0–44)
AST: 22 U/L (ref 15–41)
Albumin: 5.1 g/dL — ABNORMAL HIGH (ref 3.5–5.0)
Alkaline Phosphatase: 38 U/L (ref 38–126)
Anion gap: 12 (ref 5–15)
BUN: 7 mg/dL (ref 6–20)
CO2: 18 mmol/L — ABNORMAL LOW (ref 22–32)
Calcium: 10.3 mg/dL (ref 8.9–10.3)
Chloride: 105 mmol/L (ref 98–111)
Creatinine, Ser: 0.74 mg/dL (ref 0.44–1.00)
GFR, Estimated: >60 mL/min (ref >60)
Glucose, Bld: 114 mg/dL — ABNORMAL HIGH (ref 70–99)
Potassium: 4.1 mmol/L (ref 3.5–5.1)
Sodium: 135 mmol/L (ref 135–145)
Total Bilirubin: 1 mg/dL (ref <1.2)
Total Protein: 7.9 g/dL (ref 6.5–8.1)

## 2023-03-17 LAB — CBC
HCT: 37.4 % (ref 36.0–46.0)
Hemoglobin: 13.3 g/dL (ref 12.0–15.0)
MCH: 31.4 pg (ref 26.0–34.0)
MCHC: 35.6 g/dL (ref 30.0–36.0)
MCV: 88.4 fL (ref 80.0–100.0)
Platelets: 307 10*3/uL (ref 150–400)
RBC: 4.23 MIL/uL (ref 3.87–5.11)
RDW: 11.9 % (ref 11.5–15.5)
WBC: 12.2 10*3/uL — ABNORMAL HIGH (ref 4.0–10.5)
nRBC: 0 % (ref 0.0–0.2)

## 2023-03-17 LAB — LIPASE, BLOOD: Lipase: 12 U/L (ref 11–51)

## 2023-03-17 LAB — HCG, QUANTITATIVE, PREGNANCY: hCG, Beta Chain, Quant, S: <1 m[IU]/mL (ref <5)

## 2023-03-17 MED ORDER — ONDANSETRON HCL 4 MG/2ML IJ SOLN
4.0000 mg | Freq: Once | INTRAMUSCULAR | Status: AC | PRN
  Administered 2023-03-17: 4 mg via INTRAVENOUS
  Filled 2023-03-17: qty 2

## 2023-03-17 MED ORDER — DROPERIDOL 2.5 MG/ML IJ SOLN
1.2500 mg | Freq: Once | INTRAMUSCULAR | Status: AC
  Administered 2023-03-17: 1.25 mg via INTRAVENOUS
  Filled 2023-03-17: qty 2

## 2023-03-17 MED ORDER — SODIUM CHLORIDE 0.9 % IV BOLUS: 500.0000 mL | Freq: Once | INTRAVENOUS | Status: DC

## 2023-03-17 NOTE — ED Provider Notes (Signed)
[place] EMERGENCY DEPARTMENT AT [facility] Provider Note   CSN: [id number] Arrival date & time: 03/17/23  1151     History  Chief Complaint  Patient presents with   Abdominal Pain    Lisa Cervantes is a 22 y.o. female with PMHx ADHD, headaches, anxiety/depression who presents to ED concerned for vomiting and diarrhea x6 hours. Patient also with generalized mild abdominal discomfort - denies abdominal pain. Denies concern for food poisoning  Denies fever, chest pain, dyspnea, cough, hematemesis, dysuria, hematuria, hematochezia.    Abdominal Pain      Home Medications Prior to Admission medications   Medication Sig Start Date End Date Taking? Authorizing Provider  letrozole [facility]) 2.5 MG tablet Take 2 tablets by mouth on days 5 to 9 of cycle 02/19/23   Adline Potter, NP  ondansetron (ZOFRAN-ODT) 4 MG disintegrating tablet Take 1 tablet (4 mg total) by mouth every 8 (eight) hours as needed for nausea or vomiting. 08/22/22   Waldon Merl, PA-C  Prenat-FeFmCb-DSS-FA-DHA w/o A (CITRANATAL HARMONY) 27-1-260 MG CAPS Take 1 tablet by mouth daily. 12/23/22   Adline Potter, NP      Allergies    Lo loestrin fe [norethin ace-eth estrad-fe]    Review of Systems   Review of Systems  Gastrointestinal:  Positive for abdominal pain.    Physical Exam Updated Vital Signs BP 113/80   Pulse 81   Temp (!) 97.3 F (36.3 C) (Oral)   Resp 16   Ht 5\' 3"  (1.6 m)   Wt 54.4 kg   LMP  (LMP Unknown)   SpO2 100%   BMI 21.26 kg/m  Physical Exam Vitals and nursing note reviewed.  Constitutional:      General: [name].    Appearance: She is not ill-appearing or toxic-appearing.  HENT:     Head: Normocephalic and atraumatic.  Eyes:     General: No scleral icterus.       Right eye: No discharge.        Left eye: No discharge.     Conjunctiva/sclera: Conjunctivae normal.  Cardiovascular:     Rate and Rhythm: Normal rate and regular  rhythm.  Pulmonary:     Effort: Pulmonary effort is normal.  Abdominal:     General: Abdomen is flat. Bowel sounds are normal.     Palpations: Abdomen is soft.  Skin:    General: Skin is warm and dry.  Neurological:     General: No focal deficit present.     Mental Status: She is alert. Mental status is at baseline.  Psychiatric:        Mood and Affect: Mood normal.        Behavior: Behavior normal.     ED Results / Procedures / Treatments   Labs (all labs ordered are listed, but only abnormal results are displayed) Labs Reviewed  COMPREHENSIVE METABOLIC PANEL - Abnormal; Notable for the following components:      Result Value   CO2 18 (*)    Glucose, Bld 114 (*)    Albumin 5.1 (*)    All other components within normal limits  CBC - Abnormal; Notable for the following components:   WBC 12.2 (*)    All other components within normal limits  LIPASE, BLOOD  HCG, QUANTITATIVE, PREGNANCY    EKG None  Radiology No results found.  Procedures Procedures    Medications Ordered in ED Medications  sodium chloride 0.9 % bolus 500  mL (has no administration in time range)  ondansetron (ZOFRAN) injection 4 mg (4 mg Intravenous Given 03/17/23 1219)  droperidol (INAPSINE) 2.5 MG/ML injection 1.25 mg (1.25 mg Intravenous Given 03/17/23 1333)    ED Course/ Medical Decision Making/ A&P                                 Medical Decision Making Amount and/or Complexity of Data Reviewed Labs: ordered.  Risk Prescription drug management.    This patient presents to the ED for concern of abdominal pain, this involves an extensive number of treatment options, and is a complaint that carries with it a high risk of complications and morbidity.  The differential diagnosis includes gastroenteritis, colitis, small bowel obstruction, appendicitis, cholecystitis, pancreatitis, nephrolithiasis, UTI, pyleonephritis, ruptured ectopic pregnancy, PID, ovarian torsion.   Co morbidities that  complicate the patient evaluation  none   Additional history obtained:  Dr. Adriana Simas PCP   Lab Tests:  I Ordered, and personally interpreted labs.  The pertinent results include: CBC with differential: Leukocytosis at 12.2.  No anemia. CMP: CO2 at 18 - rest is reassuring Lipase: within normal limits HCG: negative   Problem List / ED Course / Critical interventions / Medication management  Patient presented for vomiting and diarrhea x6 hours. Endorses cannabis use last night. Denies abdominal pain - states that she has abdominal "discomfort" that is difficult to describe. Denying any other infectious symptoms today.  Patient afebrile with stable vitals.  Patient is overall well-appearing. Patient continuing to vomit after initial dose of Zofran - provided patient with a dose of droperidol which helped resolve symptoms. hCG negative.  CBC with leukocytosis of 12.2 which I believe is due to her vomiting/diarrhea.  Lipase within normal limits.  CMP with low CO2 at 18 which I also believe is due to her vomiting and diarrhea. I have reviewed the patients home medicines and have made adjustments as needed Ordered patient IV fluids. Patient stating that her grandmother is coming to pick her up pain she needs to leave. I was not able to talk with patient before she left d/t the busy nature of this free standing ED. Nursing staff stating that patient signed AMA paperwork.   Ddx: These are considered less likely due to history of present illness and physical exam. -gastroenteritis: No vomiting in ED; no fever; tolerating PO intake -small bowel obstruction: Last BM today  -cholecystitis: liver enzymes within normal limits  -pancreatitis: No LUQ tenderness to palpation, lipase within normal limits  -nephrolithiasis: Denies flank pain and urinary complaints  -UTI/pyelonephritis: Denies urinary complaints  -ruptured ectopic pregnancy, PID, ovarian torsion: no pelvic pain   Social Determinants of  Health:  none          Final Clinical Impression(s) / ED Diagnoses Final diagnoses:  Vomiting and diarrhea    Rx / DC Orders ED Discharge Orders     None         Dorthy Cooler, [place] 03/17/23 1448    Virgina Norfolk, DO 03/18/23 6213

## 2023-03-17 NOTE — ED Triage Notes (Signed)
Pt bib by [facility] c/o abd pain and vomiting/diarrhea since 6am. Afebrile. Pt states she smoked weed last night. Actively vomiting during triage

## 2023-03-18 ENCOUNTER — Ambulatory Visit: Payer: Medicaid Other | Admitting: Nurse Practitioner

## 2023-03-18 ENCOUNTER — Encounter: Payer: Self-pay | Admitting: Nurse Practitioner

## 2023-03-18 VITALS — BP 110/70 | HR 64 | Ht 63.0 in | Wt 114.0 lb

## 2023-03-18 DIAGNOSIS — Z8349 Family history of other endocrine, nutritional and metabolic diseases: Secondary | ICD-10-CM | POA: Diagnosis not present

## 2023-03-18 DIAGNOSIS — N926 Irregular menstruation, unspecified: Secondary | ICD-10-CM

## 2023-03-18 DIAGNOSIS — N970 Female infertility associated with anovulation: Secondary | ICD-10-CM

## 2023-03-18 LAB — PREGNANCY, URINE: Preg Test, Ur: NEGATIVE

## 2023-03-18 MED ORDER — MEDROXYPROGESTERONE ACETATE 5 MG PO TABS: 5.0000 mg | ORAL_TABLET | Freq: Every day | ORAL | 2 refills | Status: AC

## 2023-03-18 MED ORDER — CLOMID 50 MG PO TABS: 100.0000 mg | ORAL_TABLET | Freq: Every day | ORAL | 2 refills | Status: DC

## 2023-03-18 NOTE — Progress Notes (Signed)
   Acute Office Visit  Subjective:    Patient ID: Lisa Cervantes, female    DOB: 2000-08-29, 22 y.o.   MRN: [id number]   HPI 22 y.o. G0 presents today for irregular periods and infertility. Menarche age 48. Cycles were regular. Started birth control at age 20 for dysmenorrhea. Discontinued at age 32. Cycles were irregular after stopping. Cycles range from 35-55 days. Has seen OBGYN, labs completed - Normal FSH, LH, testosterone, low progesterone. Ovulated 2 out of 3 clomid cycles and 1 out of 4 Letrozole cycles. Currently on Letrozole. Tracking cycle but difficult due to irregularity. Reports healthy living, does vape but has decreased use and plans to switch to zero nicotine soon. Occasional marijuana use. Partner healthy with h/o 1 pregnancy with old partner. Taking PNV. Thyroid disease on father's side. No known family history of endometriosis, PCOS. Maternal grandmother with infertility. Pap 03/11/2022 normal. Neg serum hcg yesterday.   Patient's last menstrual period was 02/16/2023. Period Duration (Days): 5 Period Pattern: (!) Irregular Menstrual Flow:  (her last period was just clots.) Dysmenorrhea: (!) Severe Dysmenorrhea Symptoms: Cramping, Nausea, Headache  Review of Systems  Constitutional: Negative.   Genitourinary:  Positive for menstrual problem.       Objective:    Physical Exam Constitutional:      Appearance: Normal appearance.  Genitourinary:    General: Normal vulva.     Vagina: Normal.     Cervix: Normal.     Uterus: Normal.      Adnexa: Right adnexa normal and left adnexa normal.     BP 110/70   Pulse 64   Ht 5\' 3"  (1.6 m)   Wt 114 lb (51.7 kg)   LMP 02/16/2023   SpO2 100%   BMI 20.19 kg/m  Wt Readings from Last 3 Encounters:  03/18/23 114 lb (51.7 kg)  03/17/23 120 lb (54.4 kg)  03/11/22 134 lb 8 oz (61 kg)        Patient informed chaperone available to be present for breast and/or pelvic exam. Patient has requested no chaperone to be  present. Patient has been advised what will be completed during breast and pelvic exam.   Assessment & Plan:   Problem List Items Addressed This Visit   None Visit Diagnoses     Primary anovulatory infertility    -  Primary   Relevant Medications   clomiPHENE (CLOMID) 50 MG tablet   medroxyPROGESTERone (PROVERA) 5 MG tablet   Other Relevant Orders   US PELVIS TRANSVAGINAL NON-OB (TV ONLY)   Progesterone   Irregular menses       Relevant Medications   medroxyPROGESTERone (PROVERA) 5 MG tablet   Other Relevant Orders   Pregnancy, urine   TSH   Family history of thyroid disease       Relevant Orders   TSH      Plan: TSH today. Normal female hormone panel completed prior. Schedule ultrasound. Provera cycle day 28 x 5 days (pregnancy test prior), then Clomid cycle days 5-9, return cycle day 21 for progesterone. Educated on healthy living, stopping nicotine, continue PNV, continue tracking cycles. If no pregnancy in 3 months will refer to [facility]. Offered referral now but wants to wait. Allergy listed for lo loestrin for mood swings, acne. Patient has tolerated Clomid in the past and aware this is not a true allergy.        Olivia Mackie DNP, 2:47 PM 03/18/2023

## 2023-03-19 DIAGNOSIS — R112 Nausea with vomiting, unspecified: Secondary | ICD-10-CM | POA: Diagnosis not present

## 2023-03-19 DIAGNOSIS — R1084 Generalized abdominal pain: Secondary | ICD-10-CM | POA: Diagnosis not present

## 2023-03-19 DIAGNOSIS — N854 Malposition of uterus: Secondary | ICD-10-CM | POA: Diagnosis not present

## 2023-03-19 DIAGNOSIS — N838 Other noninflammatory disorders of ovary, fallopian tube and broad ligament: Secondary | ICD-10-CM | POA: Diagnosis not present

## 2023-03-19 LAB — TSH: TSH: 1.65 m[IU]/L

## 2023-03-20 ENCOUNTER — Other Ambulatory Visit: Payer: Self-pay | Admitting: Family Medicine

## 2023-03-20 MED ORDER — ONDANSETRON 4 MG PO TBDP: 4.0000 mg | ORAL_TABLET | Freq: Three times a day (TID) | ORAL | 0 refills | Status: DC | PRN

## 2023-03-20 NOTE — Telephone Encounter (Signed)
Copied from CRM [id number]. Topic: Clinical - Prescription Issue >> Mar 20, 2023  1:41 PM Mosetta Putt H wrote: Reason for CRM: ***

## 2023-03-21 ENCOUNTER — Encounter: Payer: Self-pay | Admitting: Nurse Practitioner

## 2023-03-21 NOTE — Telephone Encounter (Signed)
Medicaid will not cover clomid, she will need to pay out of pocket.

## 2023-03-24 NOTE — Telephone Encounter (Signed)
I am not sure how they got in covered, medicaid does not cover fertility treatments.

## 2023-03-26 ENCOUNTER — Ambulatory Visit: Payer: Medicaid Other | Admitting: Nurse Practitioner

## 2023-03-26 VITALS — BP 106/65 | HR 77 | Temp 98.6°F | Wt 120.0 lb

## 2023-03-26 DIAGNOSIS — D72829 Elevated white blood cell count, unspecified: Secondary | ICD-10-CM | POA: Diagnosis not present

## 2023-03-26 DIAGNOSIS — F419 Anxiety disorder, unspecified: Secondary | ICD-10-CM | POA: Diagnosis not present

## 2023-03-26 DIAGNOSIS — F322 Major depressive disorder, single episode, severe without psychotic features: Secondary | ICD-10-CM | POA: Diagnosis not present

## 2023-03-26 DIAGNOSIS — E876 Hypokalemia: Secondary | ICD-10-CM

## 2023-03-26 MED ORDER — ONDANSETRON 4 MG PO TBDP: 4.0000 mg | ORAL_TABLET | Freq: Three times a day (TID) | ORAL | 0 refills | Status: DC | PRN

## 2023-03-26 NOTE — Patient Instructions (Signed)
 High-Fiber Eating Plan Fiber, also called dietary fiber, is found in foods such as fruits, vegetables, whole grains, and beans. A high-fiber diet can be good for your health. Your health care provider may recommend a high-fiber diet to help: Prevent trouble pooping (constipation). Lower your cholesterol. Treat the following conditions: Hemorrhoids. This is inflammation of veins in the anus. Inflammation of specific areas of the digestive tract. Irritable bowel syndrome (IBS). This is a problem of the large intestine, also called the colon, that sometimes causes belly pain and bloating. Prevent overeating as part of a weight-loss plan. Lower the risk of heart disease, type 2 diabetes, and certain cancers. What are tips for following this plan? Reading food labels  Check the nutrition facts label on foods for the amount of dietary fiber. Choose foods that have 4 grams of fiber or more per serving. The recommended goals for how much fiber you should eat each day include: Males 40 years old or younger: 30-34 g. Males over 37 years old: 28-34 g. Females 64 years old or younger: 25-28 g. Females over 69 years old: 22-25 g. Your daily fiber goal is _____________ g. Shopping Choose whole fruits and vegetables instead of processed. For example, choose apples instead of apple juice or applesauce. Choose a variety of high-fiber foods such as avocados, lentils, oats, and pinto beans. Read the nutrition facts label on foods. Check for foods with added fiber. These foods often have high sugar and salt (sodium) amounts per serving. Cooking Use whole-grain flour for baking and cooking. Cook with brown rice instead of white rice. Make meals that have a lot of beans and vegetables in them, such as chili or vegetable-based soups. Meal planning Start the day with a breakfast that is high in fiber, such as a cereal that has 5 g of fiber or more per serving. Eat breads and cereals that are made with  whole-grain flour instead of refined flour or white flour. Eat brown rice, bulgur wheat, or millet instead of white rice. Use beans in place of meat in soups, salads, and pasta dishes. Be sure that half of the grains you eat each day are whole grains. General information You can get the recommended amount of dietary fiber by: Eating a variety of fruits, vegetables, grains, nuts, and beans. Taking a fiber supplement if you aren't able to eat enough fiber. It's better to get fiber through food than from a supplement. Slowly increase how much fiber you eat. If you increase the amount of fiber you eat too quickly, you may have bloating, cramping, or gas. Drink plenty of water to help you digest fiber. Choose high-fiber snacks, such as berries, raw vegetables, nuts, and popcorn. What foods should I eat? Fruits Berries. Pears. Apples. Oranges. Avocado. Prunes and raisins. Dried figs. Vegetables Sweet potatoes. Spinach. Kale. Artichokes. Cabbage. Broccoli. Cauliflower. Green peas. Carrots. Squash. Grains Whole-grain breads. Multigrain cereal. Oats and oatmeal. Brown rice. Barley. Bulgur wheat. Millet. Quinoa. Bran muffins. Popcorn. Rye wafer crackers. Meats and other proteins Navy beans, kidney beans, and pinto beans. Soybeans. Split peas. Lentils. Nuts and seeds. Dairy Fiber-fortified yogurt. Fortified means that fiber has been added to the product. Beverages Fiber-fortified soy milk. Fiber-fortified orange juice. Other foods Fiber bars. The items listed above may not be all the foods and drinks you can have. Talk to a dietitian to learn more. What foods should I avoid? Fruits Fruit juice. Cooked, strained fruit. Vegetables Fried potatoes. Canned vegetables. Well-cooked vegetables. Grains White bread. Pasta made with refined  flour. White rice. Meats and other proteins Fatty meat. Fried chicken or fried fish. Dairy Milk. Cream cheese. Sour cream. Fats and  oils Butters. Beverages Soft drinks. Other foods Cakes and pastries. The items listed above may not be all the foods and drinks you should avoid. Talk to a dietitian to learn more. This information is not intended to replace advice given to you by your health care provider. Make sure you discuss any questions you have with your health care provider. Document Revised: 07/22/2022 Document Reviewed: 07/22/2022 Elsevier Patient Education  2024 [employer].

## 2023-03-26 NOTE — Progress Notes (Unsigned)
Subjective:    Patient ID: Lisa Cervantes, female    DOB: 02/23/2001, 22 y.o.   MRN: [id number]  HPI Presents for recheck after recent hospital visit. Seen on 11/6 at ED with diagnosis of cannabinoid hyperemesis. Patient disagrees with diagnosis, thinking there may have been another cause. Cut back her daily marijuana use months ago from several per day to 1-2 per day. Daily vaping with nicotine 5%, no CBD or THC.  States she had elevated blood pressure at 1 point.  Concerned about weight loss but a review of her chart shows weight loss around the time of her ED visit. Has gained her weight back to baseline. Eating healthy. No further nausea or vomiting.  Has also been experiencing a lot more depression lately  States she has trouble getting motivated to get out of bed some days.  Denies any suicidal or homicidal thoughts or ideation.  Denies any true suicidal ideation.  Just a strong feeling of malaise and disinterest.  Denies any self-harm behaviors.    03/26/2023    3:05 PM  Depression screen PHQ 2/9  Decreased Interest 2  Down, Depressed, Hopeless 2  PHQ - 2 Score 4  Altered sleeping 3  Tired, decreased energy 2  Change in appetite 3  Feeling bad or failure about yourself  3  Trouble concentrating 3  Moving slowly or fidgety/restless 2  Suicidal thoughts 2  PHQ-9 Score 22  Difficult doing work/chores Somewhat difficult      03/26/2023    3:06 PM 08/04/2020   11:27 AM 02/12/2019   10:09 AM 07/28/2018    2:48 PM  GAD 7 : Generalized Anxiety Score  Nervous, Anxious, on Edge 3 3 3 2   Control/stop worrying 3 3 3 3   Worry too much - different things 3 3 3 3   Trouble relaxing 3 2 3 2   Restless 1 1 3 2   Easily annoyed or irritable 3 3 3 3   Afraid - awful might happen 3 0 1 2  Total GAD 7 Score 19 15 19 17   Anxiety Difficulty Very difficult Somewhat difficult Extremely difficult Very difficult    Review of Systems  Constitutional:  Positive for fatigue. Negative for fever.   Respiratory:  Negative for cough, chest tightness and shortness of breath.   Cardiovascular:  Negative for chest pain and palpitations.  Gastrointestinal:  Negative for abdominal pain, constipation, diarrhea, nausea and vomiting.   Social History   Tobacco Use   Smoking status: Former    Types: Cigarettes   Smokeless tobacco: Never  Vaping Use   Vaping status: Every Day   Substances: Nicotine   Devices: contains 5% nicotine  Substance Use Topics   Alcohol use: Yes    Comment: occ   Drug use: Yes    Types: Marijuana    Comment: 1-2 marijuana cigarettes per day        Objective:   Physical Exam NAD.  Alert, oriented.  Mildly flat affect.  Making good eye contact.  Speech clear.  Calm and cooperative.  Dressed appropriately for the weather.  Good hygiene.  Thoughts logical coherent and relevant.  Normal behavior and mood.  Normal judgment.  Lungs clear.  Heart regular rate rhythm. Today's Vitals   03/26/23 1500  BP: 106/65  Pulse: 77  Temp: 98.6 F (37 C)  SpO2: 99%  Weight: 120 lb (54.4 kg)   Body mass index is 21.26 kg/m. Patient has gained back 6 pounds since her earlier illness, back to her  baseline.  A review of her blood pressures at our office showed normal readings over time.       Assessment & Plan:   Problem List Items Addressed This Visit       Other   Anxiety   Major depressive disorder, single episode, severe without psychotic features (HCC) - Primary   Other Visit Diagnoses     Hypokalemia       Relevant Orders   Potassium (Completed)   Leukocytosis, unspecified type       Relevant Orders   CBC with Differential/Platelet (Completed)      Meds ordered this encounter  Medications   ondansetron (ZOFRAN-ODT) 4 MG disintegrating tablet    Sig: Take 1 tablet (4 mg total) by mouth every 8 (eight) hours as needed for nausea or vomiting.    Dispense:  20 tablet    Refill:  0   sertraline (ZOLOFT) 50 MG tablet    Sig: Take 1 tablet (50 mg  total) by mouth daily.    Dispense:  30 tablet    Refill:  0    Order Specific Question:   Supervising Provider    Answer:   Lilyan Punt A [id number]   Patient has taken sertraline in the past without any side effects, did not notice any improvement on the lowest dose.  Discussed options.  Will restart sertraline with plans to slowly titrate as needed and if tolerated.  Discontinue medication if any adverse effects including suicidal thoughts. Given refill on her Zofran to have on hand in case it is needed. Referred to psychiatry for evaluation and medication management. Discussed nicotine use with vaping as well as advise caution with marijuana use due to possible additives.  Advised patient not to vape with any CBD or THC. Return in about 1 month (around 04/25/2023) for video or in person is fine. Call back sooner if needed.

## 2023-03-27 ENCOUNTER — Encounter: Payer: Self-pay | Admitting: Nurse Practitioner

## 2023-03-27 LAB — CBC WITH DIFFERENTIAL/PLATELET
Basophils Absolute: 0.1 10*3/uL (ref 0.0–0.2)
Basos: 1 %
EOS (ABSOLUTE): 0.1 10*3/uL (ref 0.0–0.4)
Eos: 1 %
Hematocrit: 37.6 % (ref 34.0–46.6)
Hemoglobin: 12.3 g/dL (ref 11.1–15.9)
Immature Grans (Abs): 0 10*3/uL (ref 0.0–0.1)
Immature Granulocytes: 0 %
Lymphocytes Absolute: 2.7 10*3/uL (ref 0.7–3.1)
Lymphs: 35 %
MCH: 31.6 pg (ref 26.6–33.0)
MCHC: 32.7 g/dL (ref 31.5–35.7)
MCV: 97 fL (ref 79–97)
Monocytes Absolute: 0.7 10*3/uL (ref 0.1–0.9)
Monocytes: 9 %
Neutrophils Absolute: 4.2 10*3/uL (ref 1.4–7.0)
Neutrophils: 54 %
Platelets: 333 10*3/uL (ref 150–450)
RBC: 3.89 x10E6/uL (ref 3.77–5.28)
RDW: 11.9 % (ref 11.7–15.4)
WBC: 7.8 10*3/uL (ref 3.4–10.8)

## 2023-03-27 LAB — POTASSIUM: Potassium: 4.3 mmol/L (ref 3.5–5.2)

## 2023-03-27 MED ORDER — SERTRALINE HCL 50 MG PO TABS: 50.0000 mg | ORAL_TABLET | Freq: Every day | ORAL | 0 refills | Status: DC

## 2023-03-31 ENCOUNTER — Other Ambulatory Visit: Payer: Self-pay | Admitting: Nurse Practitioner

## 2023-03-31 ENCOUNTER — Encounter: Payer: Self-pay | Admitting: Nurse Practitioner

## 2023-04-03 ENCOUNTER — Encounter: Payer: Self-pay | Admitting: Nurse Practitioner

## 2023-04-03 ENCOUNTER — Other Ambulatory Visit: Payer: Self-pay | Admitting: Nurse Practitioner

## 2023-04-03 ENCOUNTER — Ambulatory Visit (INDEPENDENT_AMBULATORY_CARE_PROVIDER_SITE_OTHER): Payer: Medicaid Other

## 2023-04-03 ENCOUNTER — Ambulatory Visit: Payer: Medicaid Other | Admitting: Nurse Practitioner

## 2023-04-03 VITALS — BP 110/72 | HR 77

## 2023-04-03 DIAGNOSIS — N970 Female infertility associated with anovulation: Secondary | ICD-10-CM | POA: Diagnosis not present

## 2023-04-03 MED ORDER — LETROZOLE 2.5 MG PO TABS: 7.0000 mg | ORAL_TABLET | Freq: Every day | ORAL | 2 refills | Status: DC

## 2023-04-03 NOTE — Telephone Encounter (Signed)
[facility] called to confirm Rx instructions for Letrozole 2.5 mg tab.  Spoke with Desiree.  Advised Take 3 tablets (7.5 mg total) by mouth  daily days 5 to 9 of cycle   Read back and confirmed.

## 2023-04-03 NOTE — Progress Notes (Signed)
   Acute Office Visit  Subjective:    Patient ID: Lisa Cervantes, female    DOB: Dec 03, 2000, 22 y.o.   MRN: [id number]   HPI 22 y.o. presents today for ultrasound. Having irregular periods. Trying to conceive. Diagnosed with primary anovulatory infertility. See previous visit note 03/18/23. Prescribed Clomid at last visit per request since she ovulated more with it than Letrozole but Medicaid does not cover and does not think she can pay out of pocket. Currently taking Letrozole 5 mg. Asking about starting Metformin. Negative PCOS workup in 2022.  Patient's last menstrual period was 03/21/2023.    Review of Systems  Constitutional: Negative.   Genitourinary:  Positive for menstrual problem.       Objective:    Physical Exam Constitutional:      Appearance: Normal appearance.   GU: Not indicated  BP 110/72   Pulse 77   LMP 03/21/2023   SpO2 100%  Wt Readings from Last 3 Encounters:  03/26/23 120 lb (54.4 kg)  03/18/23 114 lb (51.7 kg)  03/17/23 120 lb (54.4 kg)       Assessment & Plan:   Problem List Items Addressed This Visit   None Visit Diagnoses     Primary anovulatory infertility    -  Primary      Vaginal ultrasound: Anteverted uterus, normal size and shape, no myometrial masses.  Thin, symmetrical endometrium.  No masses or thickening seen, avascular.  Both ovaries mobile, normal size with normal follicle pattern.  No adnexal masses, no free fluid.  Plan: Reviewed normal ultrasound. Insurance will not cover Clomid. Continue Provera and Letrozole. Metformin not appropriate. Can continue cycle day 21 progesterone levels. Recommend fertility specialist and will help with referral when she is ready.   Return if symptoms worsen or fail to improve.    Olivia Mackie DNP, 11:10 AM 04/03/2023

## 2023-04-06 ENCOUNTER — Encounter: Payer: Self-pay | Admitting: Nurse Practitioner

## 2023-04-14 DIAGNOSIS — R051 Acute cough: Secondary | ICD-10-CM | POA: Diagnosis not present

## 2023-04-14 DIAGNOSIS — J020 Streptococcal pharyngitis: Secondary | ICD-10-CM | POA: Diagnosis not present

## 2023-04-28 ENCOUNTER — Encounter: Payer: Self-pay | Admitting: Nurse Practitioner

## 2023-04-29 ENCOUNTER — Ambulatory Visit: Payer: Medicaid Other | Admitting: Nurse Practitioner

## 2023-04-30 ENCOUNTER — Encounter: Payer: Self-pay | Admitting: Nurse Practitioner

## 2023-04-30 ENCOUNTER — Encounter: Payer: Self-pay | Admitting: Family Medicine

## 2023-04-30 DIAGNOSIS — N970 Female infertility associated with anovulation: Secondary | ICD-10-CM

## 2023-05-09 ENCOUNTER — Ambulatory Visit: Payer: Medicaid Other | Admitting: Nurse Practitioner

## 2023-05-09 VITALS — BP 122/78 | Wt 120.2 lb

## 2023-05-09 DIAGNOSIS — F418 Other specified anxiety disorders: Secondary | ICD-10-CM | POA: Insufficient documentation

## 2023-05-09 DIAGNOSIS — F4321 Adjustment disorder with depressed mood: Secondary | ICD-10-CM | POA: Diagnosis not present

## 2023-05-09 MED ORDER — MIRTAZAPINE 15 MG PO TABS: 15.0000 mg | ORAL_TABLET | Freq: Every day | ORAL | 0 refills | Status: DC

## 2023-05-12 ENCOUNTER — Encounter: Payer: Self-pay | Admitting: Nurse Practitioner

## 2023-05-12 NOTE — Progress Notes (Signed)
Subjective:    Patient ID: Lisa Cervantes, female    DOB: 2000/12/03, 22 y.o.   MRN: [id number]  HPI Presents for recheck on her anxiety and depression.  Patient states she has been struggling with grief over the past year since the loss of her father.  Would like to begin grief counseling/therapy.  Does not want to do group counseling.  Has noticed a significant increase in the number of her migraines since starting sertraline.  Has been on multiple meds over the years, states Remeron seem to work the best for her in the past.  Denies suicidal or homicidal thoughts or ideation.  Denies any self-harm behaviors.   Review of Systems  Constitutional:  Positive for fatigue.  Respiratory:  Negative for cough, chest tightness, shortness of breath and wheezing.   Psychiatric/Behavioral:  Positive for dysphoric mood. Negative for decreased concentration, self-injury and suicidal ideas. The patient is nervous/anxious.       05/09/2023    2:54 PM  Depression screen PHQ 2/9  Decreased Interest 2  Down, Depressed, Hopeless 1  PHQ - 2 Score 3  Altered sleeping 3  Tired, decreased energy 2  Change in appetite 2  Feeling bad or failure about yourself  1  Trouble concentrating 2  Moving slowly or fidgety/restless 1  Suicidal thoughts 0  PHQ-9 Score 14  Difficult doing work/chores Very difficult      05/09/2023    2:55 PM 03/26/2023    3:06 PM 08/04/2020   11:27 AM 02/12/2019   10:09 AM  GAD 7 : Generalized Anxiety Score  Nervous, Anxious, on Edge 2 3 3 3   Control/stop worrying 2 3 3 3   Worry too much - different things 2 3 3 3   Trouble relaxing 2 3 2 3   Restless 1 1 1 3   Easily annoyed or irritable 2 3 3 3   Afraid - awful might happen 1 3 0 1  Total GAD 7 Score 12 19 15 19   Anxiety Difficulty Very difficult Very difficult Somewhat difficult Extremely difficult   Social History   Tobacco Use   Smoking status: Former    Types: Cigarettes   Smokeless tobacco: Never  Vaping Use    Vaping status: Every Day   Substances: Nicotine   Devices: contains 5% nicotine  Substance Use Topics   Alcohol use: Yes    Comment: occ   Drug use: Yes    Types: Marijuana    Comment: 1-2 marijuana cigarettes per day          Objective:   Physical Exam NAD.  Alert, oriented.  Calm affect.  Making good eye contact.  Speech clear.  Mood judgment and behavior normal.  Dressed appropriately for the weather.  Lungs clear.  Heart regular rate rhythm. Today's Vitals   05/09/23 1453  BP: 122/78  Weight: 120 lb 3.2 oz (54.5 kg)   Body mass index is 21.29 kg/m.        Assessment & Plan:   Problem List Items Addressed This Visit       Other   Depression with anxiety - Primary   Relevant Medications   mirtazapine (REMERON) 15 MG tablet   Other Relevant Orders   Ambulatory referral to Psychology   Grief   Relevant Orders   Ambulatory referral to Psychology   Meds ordered this encounter  Medications   mirtazapine (REMERON) 15 MG tablet    Sig: Take 1 tablet (15 mg total) by mouth at bedtime.  Dispense:  30 tablet    Refill:  0    Supervising Provider:   Lilyan Punt A [[id number]]   Discontinue Zoloft.  Note that patient is not taking it today so she can begin mirtazapine this evening.  Reviewed potential adverse effects.  Discontinue medication and contact office if any problems.  Plan to slowly titrate dose if needed and tolerated.  Patient to send a note through MyChart to let us know how medication and dose is working.  If no improvement, recommend looking at a mood disorder and or using [facility] for assessment.  Patient agrees with this plan. Return in about 3 months (around 08/07/2023). Call back sooner if needed.

## 2023-05-15 NOTE — Telephone Encounter (Signed)
 Tiffany -please review and advise on labs.

## 2023-05-15 NOTE — Telephone Encounter (Signed)
 OK to come in for labs today.

## 2023-05-19 DIAGNOSIS — H5213 Myopia, bilateral: Secondary | ICD-10-CM | POA: Diagnosis not present

## 2023-05-22 NOTE — Telephone Encounter (Signed)
 Per review of EPIC, patient should have refills on file. Please advise if any additional recommendations.   Patient did not return for labs.

## 2023-06-10 NOTE — Telephone Encounter (Signed)
Order faxed to [facility] in [place], [place] per pt's preference at [phone]. Confirmation received.  Routing to provider for final review.  Encounter closed.

## 2023-06-10 NOTE — Telephone Encounter (Signed)
Order re-faxed to [facility] location per pt's preference at [phone].

## 2023-06-11 ENCOUNTER — Other Ambulatory Visit: Payer: Self-pay | Admitting: Nurse Practitioner

## 2023-06-11 NOTE — Telephone Encounter (Signed)
Ok to do today

## 2023-06-12 ENCOUNTER — Encounter: Payer: Self-pay | Admitting: Nurse Practitioner

## 2023-06-12 LAB — PROGESTERONE: Progesterone: 12.7 ng/mL

## 2023-06-24 ENCOUNTER — Other Ambulatory Visit: Payer: Self-pay | Admitting: Nurse Practitioner

## 2023-06-24 DIAGNOSIS — Z20822 Contact with and (suspected) exposure to covid-19: Secondary | ICD-10-CM | POA: Diagnosis not present

## 2023-06-24 DIAGNOSIS — N920 Excessive and frequent menstruation with regular cycle: Secondary | ICD-10-CM | POA: Diagnosis not present

## 2023-06-24 DIAGNOSIS — H66002 Acute suppurative otitis media without spontaneous rupture of ear drum, left ear: Secondary | ICD-10-CM | POA: Diagnosis not present

## 2023-06-24 DIAGNOSIS — R070 Pain in throat: Secondary | ICD-10-CM | POA: Diagnosis not present

## 2023-06-24 MED ORDER — ONDANSETRON 4 MG PO TBDP: 4.0000 mg | ORAL_TABLET | Freq: Three times a day (TID) | ORAL | 0 refills | Status: AC | PRN

## 2023-06-26 ENCOUNTER — Other Ambulatory Visit: Payer: Self-pay | Admitting: Nurse Practitioner

## 2023-06-26 DIAGNOSIS — N970 Female infertility associated with anovulation: Secondary | ICD-10-CM

## 2023-06-26 NOTE — Telephone Encounter (Signed)
Med refill request: Femara Last OV: 04/03/23 Next AEX: not scheduled Last MMG (if hormonal med) n/a Refill authorized: Please Advise, #15, 2 RF

## 2023-07-01 ENCOUNTER — Encounter: Payer: Self-pay | Admitting: Nurse Practitioner

## 2023-07-01 DIAGNOSIS — Z789 Other specified health status: Secondary | ICD-10-CM

## 2023-07-02 ENCOUNTER — Other Ambulatory Visit: Payer: Self-pay | Admitting: Nurse Practitioner

## 2023-07-02 DIAGNOSIS — Z789 Other specified health status: Secondary | ICD-10-CM

## 2023-07-14 NOTE — Telephone Encounter (Signed)
 Order released and will also manually fax to preferred location.

## 2023-07-15 ENCOUNTER — Encounter: Payer: Self-pay | Admitting: Nurse Practitioner

## 2023-07-15 ENCOUNTER — Telehealth: Payer: Medicaid Other | Admitting: Nurse Practitioner

## 2023-07-24 ENCOUNTER — Encounter: Payer: Self-pay | Admitting: Nurse Practitioner

## 2023-07-24 ENCOUNTER — Ambulatory Visit: Admitting: Nurse Practitioner

## 2023-07-24 VITALS — BP 110/66 | HR 69 | Temp 98.4°F | Ht 63.0 in | Wt 119.0 lb

## 2023-07-24 DIAGNOSIS — F5104 Psychophysiologic insomnia: Secondary | ICD-10-CM | POA: Diagnosis not present

## 2023-07-24 DIAGNOSIS — Z113 Encounter for screening for infections with a predominantly sexual mode of transmission: Secondary | ICD-10-CM | POA: Diagnosis not present

## 2023-07-24 DIAGNOSIS — B9689 Other specified bacterial agents as the cause of diseases classified elsewhere: Secondary | ICD-10-CM | POA: Diagnosis not present

## 2023-07-24 DIAGNOSIS — N760 Acute vaginitis: Secondary | ICD-10-CM

## 2023-07-24 MED ORDER — METRONIDAZOLE 0.75 % EX GEL: CUTANEOUS | 0 refills | Status: DC

## 2023-07-25 ENCOUNTER — Telehealth: Payer: Self-pay

## 2023-07-25 ENCOUNTER — Encounter: Payer: Self-pay | Admitting: Nurse Practitioner

## 2023-07-25 ENCOUNTER — Telehealth: Payer: Self-pay | Admitting: Nurse Practitioner

## 2023-07-25 ENCOUNTER — Other Ambulatory Visit: Payer: Self-pay | Admitting: Nurse Practitioner

## 2023-07-25 MED ORDER — METRONIDAZOLE 0.75 % VA GEL
1.0000 | Freq: Two times a day (BID) | VAGINAL | 0 refills | Status: AC
Start: 2023-07-25 — End: ?

## 2023-07-25 MED ORDER — MIRTAZAPINE 30 MG PO TABS: 30.0000 mg | ORAL_TABLET | Freq: Every day | ORAL | 0 refills | Status: DC

## 2023-07-25 NOTE — Telephone Encounter (Signed)
 Copied from CRM [id number]. Topic: Clinical - Prescription Issue >> Jul 25, 2023  8:54 AM Turkey B wrote: Reason for CRM: [facility] in states has vaginal gel, that says take twice dialy for 7 days, but the rx only has 5 applicators

## 2023-07-25 NOTE — Progress Notes (Signed)
 Subjective:    Patient ID: Lisa Cervantes, female    DOB: 2000/07/06, 23 y.o.   MRN: [id number]  HPI Presents to discuss her recurrent bacterial vaginosis.  States he got better after her last treatment but never quite goes away.  Has noticed slight increase in symptoms around the time of her cycle.  No pelvic pain.  No fever.  Has been with the same long-term female partner. Also patient is currently on mirtazapine 15 mg for sleep.  Does not seem to be working as well as initially. Continues follow-up with gynecology regarding fertility issues.  Currently on letrozole.  Has had some light spotting for the past couple of days, has contacted their office. When asked about her screening scores, patient mentions she is in a good place at this time, has a good job and doing well.  Has cut back her marijuana use to 1 cigarette/day.  Review of Systems  Constitutional:  Positive for fatigue. Negative for fever.  Respiratory:  Negative for cough, chest tightness, shortness of breath and wheezing.       07/24/2023    4:04 PM  Depression screen PHQ 2/9  Decreased Interest 2  Down, Depressed, Hopeless 1  PHQ - 2 Score 3  Altered sleeping 2  Tired, decreased energy 2  Change in appetite 2  Feeling bad or failure about yourself  1  Trouble concentrating 1  Moving slowly or fidgety/restless 1  Suicidal thoughts 0  PHQ-9 Score 12  Difficult doing work/chores Very difficult      07/24/2023    4:05 PM 05/09/2023    2:55 PM 03/26/2023    3:06 PM 08/04/2020   11:27 AM  GAD 7 : Generalized Anxiety Score  Nervous, Anxious, on Edge 1 2 3 3   Control/stop worrying 3 2 3 3   Worry too much - different things 2 2 3 3   Trouble relaxing 1 2 3 2   Restless 1 1 1 1   Easily annoyed or irritable 2 2 3 3   Afraid - awful might happen 1 1 3  0  Total GAD 7 Score 11 12 19 15   Anxiety Difficulty Very difficult Very difficult Very difficult Somewhat difficult    Social History   Tobacco Use   Smoking  status: Former    Types: Cigarettes   Smokeless tobacco: Never  Vaping Use   Vaping status: Every Day   Substances: Nicotine   Devices: contains 5% nicotine  Substance Use Topics   Alcohol use: Yes    Comment: occ   Drug use: Yes    Types: Marijuana    Comment: 1-2 marijuana cigarettes per day        Objective:   Physical Exam NAD.  Alert, oriented.  Mildly anxious affect.  Cheerful.  Making good eye contact.  Dressed appropriately for the weather.  Speech clear.  Normal judgment and behavior.  Lungs clear.  Heart regular rate rhythm.  Abdomen soft nondistended nontender.  Pelvic exam and wet prep deferred due to vaginal bleeding.  A self swab was obtained by patient for gonorrhea and chlamydia screening. Today's Vitals   07/24/23 1557  BP: 110/66  Pulse: 69  Temp: 98.4 F (36.9 C)  SpO2: 98%  Weight: 119 lb (54 kg)  Height: 5\' 3"  (1.6 m)   Body mass index is 21.08 kg/m.       Assessment & Plan:   Problem List Items Addressed This Visit       Other   Psychophysiological insomnia -  Primary   Other Visit Diagnoses       Screening examination for STI       Relevant Orders   GC/Chlamydia Probe Amp([facility])     Bacterial vaginosis          Meds ordered this encounter  Medications   metroNIDAZOLE (METROGEL) 0.75 % gel    Sig: Apply vaginally at bedtime x 7 days    Dispense:  45 g    Refill:  0    Supervising Provider:   Lilyan Punt A [[id number]]   mirtazapine (REMERON) 30 MG tablet    Sig: Take 1 tablet (30 mg total) by mouth at bedtime.    Dispense:  90 tablet    Refill:  0    Supervising Provider:   Lilyan Punt A [[id number]]   Increase mirtazapine dose to 30 mg at bedtime.  Patient to take 2 of her 15 mg tabs that she has at home.  Contact office if any adverse effects.  Defers other medications at this time for anxiety and depression. Prescribe metronidazole gel for 7 days.  May need to do a maintenance dose twice a week if symptoms persist. STI testing  pending. Warning signs reviewed. Return if symptoms worsen or fail to improve.

## 2023-07-25 NOTE — Telephone Encounter (Signed)
 called return number is [id number]    States this is a face gel    metroNIDAZOLE (METROGEL) 0.75 % gel    And the instructions says to apply vaginally    And the pharmacy wants to know if this is the correct prescription    Please confirm if this is correct [id number]

## 2023-07-25 NOTE — Telephone Encounter (Signed)
 Pharmacy has been called and informed of the change, pharmacy informed us only 5 applicators come in a box and directions say apply twice daily for 7 days. Gave the okay for 3 boxes to do the correct instructions

## 2023-07-27 LAB — GC/CHLAMYDIA PROBE AMP
Chlamydia trachomatis, NAA: NEGATIVE
Neisseria Gonorrhoeae by PCR: NEGATIVE

## 2023-07-27 LAB — SPECIMEN STATUS REPORT

## 2023-07-28 ENCOUNTER — Encounter: Payer: Self-pay | Admitting: Nurse Practitioner

## 2023-08-14 NOTE — Telephone Encounter (Signed)
 Schedule OV

## 2023-08-14 NOTE — Telephone Encounter (Signed)
 Tiffany - Last OV 03/2023, please advise if OV recommended.

## 2023-10-22 ENCOUNTER — Encounter: Payer: Self-pay | Admitting: Nurse Practitioner

## 2023-10-22 ENCOUNTER — Other Ambulatory Visit: Payer: Self-pay | Admitting: Family Medicine

## 2023-10-22 MED ORDER — PERMETHRIN 1 % EX LIQD: CUTANEOUS | 0 refills | Status: AC

## 2023-10-24 NOTE — Telephone Encounter (Signed)
 Spoke with [facility] pharmacy. Sklice  is not covered by insurance and would cost $800 for 2 bottles. Patient notified and stated she will stick with original prescription as it was only $30

## 2024-04-16 ENCOUNTER — Encounter: Payer: Self-pay | Admitting: Nurse Practitioner

## 2024-04-19 ENCOUNTER — Inpatient Hospital Stay: Admitting: Nurse Practitioner

## 2024-04-29 ENCOUNTER — Encounter: Payer: Self-pay | Admitting: Nurse Practitioner

## 2024-04-29 ENCOUNTER — Ambulatory Visit: Admitting: Nurse Practitioner

## 2024-04-29 VITALS — BP 94/69 | Ht 63.0 in | Wt 118.0 lb

## 2024-04-29 DIAGNOSIS — F419 Anxiety disorder, unspecified: Secondary | ICD-10-CM | POA: Diagnosis not present

## 2024-04-29 DIAGNOSIS — R5383 Other fatigue: Secondary | ICD-10-CM

## 2024-04-29 DIAGNOSIS — Z13220 Encounter for screening for lipoid disorders: Secondary | ICD-10-CM

## 2024-04-29 DIAGNOSIS — D72829 Elevated white blood cell count, unspecified: Secondary | ICD-10-CM

## 2024-04-29 DIAGNOSIS — F339 Major depressive disorder, recurrent, unspecified: Secondary | ICD-10-CM | POA: Insufficient documentation

## 2024-04-29 DIAGNOSIS — E870 Hyperosmolality and hypernatremia: Secondary | ICD-10-CM | POA: Diagnosis not present

## 2024-04-29 DIAGNOSIS — F5104 Psychophysiologic insomnia: Secondary | ICD-10-CM | POA: Diagnosis not present

## 2024-04-29 MED ORDER — MIRTAZAPINE 30 MG PO TABS: 30.0000 mg | ORAL_TABLET | Freq: Every day | ORAL | 0 refills | Status: AC

## 2024-04-29 MED ORDER — FLUOXETINE HCL 10 MG PO CAPS: 10.0000 mg | ORAL_CAPSULE | Freq: Every day | ORAL | 0 refills | Status: AC

## 2024-04-29 NOTE — Progress Notes (Unsigned)
° °  Subjective:    Patient ID: Lisa Cervantes, female    DOB: 12-Apr-2001, 23 y.o.   MRN: [id number]  HPI    Review of Systems     04/29/2024   10:16 AM  Depression screen PHQ 2/9  Decreased Interest 1  Down, Depressed, Hopeless 1  PHQ - 2 Score 2  Altered sleeping 2  Tired, decreased energy 1  Change in appetite 2  Feeling bad or failure about yourself  0  Trouble concentrating 1  Moving slowly or fidgety/restless 1  Suicidal thoughts 0  PHQ-9 Score 9  Difficult doing work/chores Very difficult      04/29/2024   10:17 AM 07/24/2023    4:05 PM 05/09/2023    2:55 PM 03/26/2023    3:06 PM  GAD 7 : Generalized Anxiety Score  Nervous, Anxious, on Edge 1 1 2 3   Control/stop worrying 1 3 2 3   Worry too much - different things 1 2 2 3   Trouble relaxing 1 1 2 3   Restless 2 1 1 1   Easily annoyed or irritable 3 2 2 3   Afraid - awful might happen 1 1 1 3   Total GAD 7 Score 10 11 12 19   Anxiety Difficulty Very difficult Very difficult Very difficult Very difficult        Objective:   Physical Exam        Assessment & Plan:   Assessment and Plan Assessment & Plan Generalized anxiety disorder and depression Moderate anxiety and depression with increased irritability and difficulty concentrating. Previous sertraline  trial ineffective. Prozac  considered for its long half-life and safety during potential conception. Discussed potential side effects and importance of monitoring for severe side effects or worsening symptoms. - Started Prozac  10 mg daily. - Scheduled follow-up in one month to assess medication efficacy and side effects.  Psychophysiological insomnia Insomnia previously managed with mirtazapine , effective in the past. She wishes to resume. - Restart mirtazapine  15 mg at bedtime.  Attention-deficit hyperactivity disorder ADHD with difficulty concentrating and zoning out. Previous treatment interrupted. Non-stimulant options considered due to potential  pregnancy. - Provided ADHD scale for completion and return via MyChart.  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 Previous assessment indicated blocked fallopian tubes. Partner's sperm count normal. Awaiting second opinion. Discussed managing PCOS and weight to improve fertility. - Await second opinion at [facility]. - Continue fertility assessment and management.  General health maintenance Blood pressure low but normal. Weight stable. Previous labs showed elevated sodium, borderline potassium, and ketones in urine likely due to diet. Discussed hydration and regular eating habits. - Ordered basic labs to recheck blood work. - Advised on maintaining hydration and regular eating habits.

## 2024-04-30 ENCOUNTER — Ambulatory Visit: Payer: Self-pay | Admitting: Nurse Practitioner

## 2024-04-30 LAB — COMPREHENSIVE METABOLIC PANEL WITH GFR
ALT: 8 IU/L (ref 0–32)
AST: 14 IU/L (ref 0–40)
Albumin: 4.6 g/dL (ref 4.0–5.0)
Alkaline Phosphatase: 52 IU/L (ref 41–116)
BUN/Creatinine Ratio: 8 — ABNORMAL LOW (ref 9–23)
BUN: 7 mg/dL (ref 6–20)
Bilirubin Total: 0.4 mg/dL (ref 0.0–1.2)
CO2: 21 mmol/L (ref 20–29)
Calcium: 9.5 mg/dL (ref 8.7–10.2)
Chloride: 103 mmol/L (ref 96–106)
Creatinine, Ser: 0.85 mg/dL (ref 0.57–1.00)
Globulin, Total: 2.6 g/dL (ref 1.5–4.5)
Glucose: 93 mg/dL (ref 70–99)
Potassium: 4.6 mmol/L (ref 3.5–5.2)
Sodium: 140 mmol/L (ref 134–144)
Total Protein: 7.2 g/dL (ref 6.0–8.5)
eGFR: 99 mL/min/1.73

## 2024-04-30 LAB — CBC WITH DIFFERENTIAL/PLATELET
Basophils Absolute: 0.1 x10E3/uL (ref 0.0–0.2)
Basos: 1 %
EOS (ABSOLUTE): 0.1 x10E3/uL (ref 0.0–0.4)
Eos: 2 %
Hematocrit: 42.2 % (ref 34.0–46.6)
Hemoglobin: 13.9 g/dL (ref 11.1–15.9)
Immature Grans (Abs): 0 x10E3/uL (ref 0.0–0.1)
Immature Granulocytes: 0 %
Lymphocytes Absolute: 2.1 x10E3/uL (ref 0.7–3.1)
Lymphs: 35 %
MCH: 31.2 pg (ref 26.6–33.0)
MCHC: 32.9 g/dL (ref 31.5–35.7)
MCV: 95 fL (ref 79–97)
Monocytes Absolute: 0.5 x10E3/uL (ref 0.1–0.9)
Monocytes: 8 %
Neutrophils Absolute: 3.3 x10E3/uL (ref 1.4–7.0)
Neutrophils: 54 %
Platelets: 328 x10E3/uL (ref 150–450)
RBC: 4.45 x10E6/uL (ref 3.77–5.28)
RDW: 11.8 % (ref 11.7–15.4)
WBC: 6.1 x10E3/uL (ref 3.4–10.8)

## 2024-04-30 LAB — TSH+FREE T4
Free T4: 1.39 ng/dL (ref 0.82–1.77)
TSH: 0.668 u[IU]/mL (ref 0.450–4.500)

## 2024-04-30 LAB — LIPID PANEL
Chol/HDL Ratio: 4.1 ratio (ref 0.0–4.4)
Cholesterol, Total: 187 mg/dL (ref 100–199)
HDL: 46 mg/dL
LDL Chol Calc (NIH): 124 mg/dL — ABNORMAL HIGH (ref 0–99)
Triglycerides: 91 mg/dL (ref 0–149)
VLDL Cholesterol Cal: 17 mg/dL (ref 5–40)

## 2024-05-04 ENCOUNTER — Encounter: Admitting: Nurse Practitioner

## 2024-05-31 ENCOUNTER — Ambulatory Visit: Admitting: Family Medicine

## 2024-06-10 ENCOUNTER — Encounter: Admitting: Nurse Practitioner

## 2024-06-15 ENCOUNTER — Ambulatory Visit: Admitting: Family Medicine

## 2024-06-21 ENCOUNTER — Ambulatory Visit: Admitting: Family Medicine

## 2024-06-24 ENCOUNTER — Ambulatory Visit: Admitting: Nurse Practitioner
